# Patient Record
Sex: Male | Born: 1955
Health system: Southern US, Community
[De-identification: ages and names within clinical notes are randomized; demographics above are authoritative.]

## PROBLEM LIST (undated history)

## (undated) DIAGNOSIS — F17200 Nicotine dependence, unspecified, uncomplicated: Secondary | ICD-10-CM

## (undated) HISTORY — DX: Nicotine dependence, unspecified, uncomplicated: F17.200

---

## 2003-01-27 ENCOUNTER — Emergency Department (HOSPITAL_COMMUNITY): Admission: EM | Admit: 2003-01-27 | Discharge: 2003-01-27 | Payer: Self-pay | Admitting: Emergency Medicine

## 2003-01-28 ENCOUNTER — Encounter: Payer: Self-pay | Admitting: Emergency Medicine

## 2013-10-07 ENCOUNTER — Emergency Department (HOSPITAL_COMMUNITY)
Admission: EM | Admit: 2013-10-07 | Discharge: 2013-10-07 | Disposition: A | Discharge status: Home / Self Care | Payer: BC Managed Care – PPO | Attending: Emergency Medicine | Admitting: Emergency Medicine

## 2013-10-07 DIAGNOSIS — R51 Headache: Secondary | ICD-10-CM | POA: Insufficient documentation

## 2013-10-07 DIAGNOSIS — R071 Chest pain on breathing: Secondary | ICD-10-CM | POA: Insufficient documentation

## 2013-10-07 DIAGNOSIS — M545 Low back pain, unspecified: Secondary | ICD-10-CM | POA: Insufficient documentation

## 2013-10-07 DIAGNOSIS — F17200 Nicotine dependence, unspecified, uncomplicated: Secondary | ICD-10-CM | POA: Insufficient documentation

## 2013-10-07 DIAGNOSIS — R059 Cough, unspecified: Secondary | ICD-10-CM | POA: Insufficient documentation

## 2013-10-07 DIAGNOSIS — B9789 Other viral agents as the cause of diseases classified elsewhere: Secondary | ICD-10-CM | POA: Insufficient documentation

## 2013-10-07 DIAGNOSIS — B349 Viral infection, unspecified: Secondary | ICD-10-CM

## 2013-10-07 DIAGNOSIS — R000 Tachycardia, unspecified: Secondary | ICD-10-CM | POA: Insufficient documentation

## 2013-10-07 DIAGNOSIS — R0602 Shortness of breath: Secondary | ICD-10-CM | POA: Insufficient documentation

## 2013-10-07 DIAGNOSIS — J029 Acute pharyngitis, unspecified: Secondary | ICD-10-CM | POA: Insufficient documentation

## 2013-10-07 DIAGNOSIS — R5381 Other malaise: Secondary | ICD-10-CM | POA: Insufficient documentation

## 2013-10-07 DIAGNOSIS — R05 Cough: Secondary | ICD-10-CM | POA: Insufficient documentation

## 2013-10-07 DIAGNOSIS — J3489 Other specified disorders of nose and nasal sinuses: Secondary | ICD-10-CM | POA: Insufficient documentation

## 2013-10-07 DIAGNOSIS — R5383 Other fatigue: Secondary | ICD-10-CM

## 2013-10-07 MED ORDER — BENZONATATE 100 MG PO CAPS
100.0000 mg | ORAL_CAPSULE | Freq: Three times a day (TID) | ORAL | Status: DC
Start: 2013-10-07 — End: 2015-11-25

## 2013-10-07 MED ORDER — HYDROCODONE-HOMATROPINE 5-1.5 MG/5ML PO SYRP: 5.0000 mL | ORAL_SOLUTION | Freq: Four times a day (QID) | ORAL | Status: DC | PRN

## 2013-10-07 NOTE — Discharge Instructions (Signed)
Viral Infections °A virus is a type of germ. Viruses can cause: °· Minor sore throats. °· Aches and pains. °· Headaches. °· Runny nose. °· Rashes. °· Watery eyes. °· Tiredness. °· Coughs. °· Loss of appetite. °· Feeling sick to your stomach (nausea). °· Throwing up (vomiting). °· Watery poop (diarrhea). °HOME CARE  °· Only take medicines as told by your doctor. °· Drink enough water and fluids to keep your pee (urine) clear or pale yellow. Sports drinks are a good choice. °· Get plenty of rest and eat healthy. Soups and broths with crackers or rice are fine. °GET HELP RIGHT AWAY IF:  °· You have a very bad headache. °· You have shortness of breath. °· You have chest pain or neck pain. °· You have an unusual rash. °· You cannot stop throwing up. °· You have watery poop that does not stop. °· You cannot keep fluids down. °· You or your child has a temperature by mouth above 102° F (38.9° C), not controlled by medicine. °· Your baby is older than 3 months with a rectal temperature of 102° F (38.9° C) or higher. °· Your baby is 3 months old or younger with a rectal temperature of 100.4° F (38° C) or higher. °MAKE SURE YOU:  °· Understand these instructions. °· Will watch this condition. °· Will get help right away if you are not doing well or get worse. °Document Released: 08/27/2008 Document Revised: 12/07/2011 Document Reviewed: 01/20/2011 °ExitCare® Patient Information ©2014 [employer]. ° °

## 2013-10-07 NOTE — ED Provider Notes (Signed)
Medical screening examination/treatment/procedure(s) were performed by non-physician practitioner and as supervising physician I was immediately available for consultation/collaboration.  EKG Interpretation   None         Suzi RootsKevin E Cheyna Retana, MD 10/07/13 1536

## 2013-10-07 NOTE — ED Notes (Signed)
Pt c/o flu like symptoms, shortness of breath, fever and non productive cough for one week. Pt c/o sore ribs with coughing. Pt with no acute distress. Pt states his symptoms are getting worse.

## 2013-10-07 NOTE — ED Provider Notes (Signed)
CSN: [id number]     Arrival date & time 10/07/13  1458 History  This chart was scribed for non-physician practitioner working with Suzi Roots, MD by Ashley Jacobs, ED scribe. This patient was seen in room WTR5/WTR5 and the patient's care was started at 3:16 PM.   First MD Initiated Contact with Patient 10/07/13 1508     No chief complaint on file.  (Consider location/radiation/quality/duration/timing/severity/associated sxs/prior Treatment) The history is provided by the patient and medical records. No language interpreter was used.   HPI Comments: Shawn Rivera is a 58 y.o. male who presents to the Emergency Department complaining of flu-like symptoms. Pt has the associated symptoms of fatigue, constant dry cough, chest wall soreness, SOB, subjective fever, tachycardia, sinus pain, headache,lumbar back pain, myalgia, and congestion. Pt denies nausea, vomiting, and diarrhea. He has tried cough syrup to no relief. Pt had a flu shot this year.  He does not have any known allergies to medications. He currently smokes and wants to quit. Pt does not have a current PCP. No past medical history on file. No past surgical history on file. No family history on file. History  Substance Use Topics  . Smoking status: Not on file  . Smokeless tobacco: Not on file  . Alcohol Use: Not on file    Review of Systems  Constitutional: Positive for fever and fatigue. Negative for chills.  HENT: Positive for congestion, rhinorrhea, sinus pressure and sore throat.   Respiratory: Positive for cough and shortness of breath.   Cardiovascular: Positive for chest pain.  Gastrointestinal: Negative for nausea, vomiting and diarrhea.  Musculoskeletal: Positive for arthralgias, back pain and myalgias.  Neurological: Positive for headaches.  All other systems reviewed and are negative.    Allergies  Review of patient's allergies indicates not on file.  Home Medications  No current outpatient  prescriptions on file. BP 142/76  Pulse 85  Temp(Src) 98.2 F (36.8 C) (Oral)  Resp 16  SpO2 97% Physical Exam  Nursing note and vitals reviewed. Constitutional: He is oriented to person, place, and time. He appears well-developed and well-nourished. No distress.  HENT:  Head: Normocephalic and atraumatic.  Mouth/Throat: Oropharynx is clear and moist.  Eyes: Conjunctivae are normal. Pupils are equal, round, and reactive to light. No scleral icterus.  Neck: Neck supple.  Cardiovascular: Normal rate, regular rhythm, normal heart sounds and intact distal pulses.   No murmur heard. Pulmonary/Chest: Effort normal and breath sounds normal. No stridor. No respiratory distress. He has no wheezes. He has no rales.  Musculoskeletal: Normal range of motion. He exhibits no edema.  Neurological: He is alert and oriented to person, place, and time.  Skin: Skin is warm and dry. No rash noted.  Psychiatric: He has a normal mood and affect. His behavior is normal.    ED Course  Procedures (including critical care time) DIAGNOSTIC STUDIES: Oxygen Saturation is 97% on room air, normal by my interpretation.    COORDINATION OF CARE:  3:20 PM Sxs suggest of flu-like infection.  Discussed course of care with pt cough medications . Pt understands and agrees. Low suspicion for strep throat.  No hypoxia to suggest pna.  Not on ACEi to suggest ACEi induced cough  Labs Review Labs Reviewed - No data to display Imaging Review No results found.  EKG Interpretation   None       MDM   1. Viral syndrome    BP 142/76  Pulse 85  Temp(Src) 98.2 F (36.8 C) (Oral)  Resp 16  SpO2 97%  I personally performed the services described in this documentation, which was scribed in my presence. The recorded information has been reviewed and is accurate.      Fayrene HelperBowie Jhony Antrim, PA-C 10/07/13 1526

## 2015-11-25 ENCOUNTER — Encounter (HOSPITAL_COMMUNITY): Payer: Self-pay

## 2015-11-25 ENCOUNTER — Emergency Department (HOSPITAL_COMMUNITY): Payer: BLUE CROSS/BLUE SHIELD

## 2015-11-25 ENCOUNTER — Emergency Department (HOSPITAL_COMMUNITY)
Admission: EM | Admit: 2015-11-25 | Discharge: 2015-11-25 | Disposition: A | Discharge status: Home / Self Care | Payer: BLUE CROSS/BLUE SHIELD | Attending: Emergency Medicine | Admitting: Emergency Medicine

## 2015-11-25 DIAGNOSIS — R05 Cough: Secondary | ICD-10-CM | POA: Diagnosis present

## 2015-11-25 DIAGNOSIS — R61 Generalized hyperhidrosis: Secondary | ICD-10-CM | POA: Insufficient documentation

## 2015-11-25 DIAGNOSIS — B349 Viral infection, unspecified: Secondary | ICD-10-CM | POA: Diagnosis not present

## 2015-11-25 DIAGNOSIS — F17200 Nicotine dependence, unspecified, uncomplicated: Secondary | ICD-10-CM | POA: Insufficient documentation

## 2015-11-25 MED ORDER — PREDNISONE 20 MG PO TABS: 40.0000 mg | ORAL_TABLET | Freq: Every day | ORAL | Status: DC

## 2015-11-25 MED ORDER — IBUPROFEN 800 MG PO TABS
800.0000 mg | ORAL_TABLET | Freq: Once | ORAL | Status: DC
  Filled 2015-11-25 (×2): qty 1

## 2015-11-25 MED ORDER — BENZONATATE 100 MG PO CAPS
100.0000 mg | ORAL_CAPSULE | Freq: Once | ORAL | Status: AC
  Administered 2015-11-25: 100 mg via ORAL
  Filled 2015-11-25: qty 1

## 2015-11-25 MED ORDER — FLUTICASONE PROPIONATE 50 MCG/ACT NA SUSP
2.0000 | Freq: Every day | NASAL | Status: DC
  Administered 2015-11-25: 2 via NASAL
  Filled 2015-11-25: qty 16

## 2015-11-25 MED ORDER — FLUTICASONE PROPIONATE 50 MCG/ACT NA SUSP
2.0000 | Freq: Every day | NASAL | Status: DC
  Filled 2015-11-25: qty 16

## 2015-11-25 MED ORDER — HYDROCODONE-HOMATROPINE 5-1.5 MG/5ML PO SYRP: 5.0000 mL | ORAL_SOLUTION | Freq: Every evening | ORAL | Status: DC | PRN

## 2015-11-25 MED ORDER — IBUPROFEN 800 MG PO TABS
800.0000 mg | ORAL_TABLET | Freq: Once | ORAL | Status: AC
  Administered 2015-11-25: 800 mg via ORAL

## 2015-11-25 MED ORDER — PREDNISONE 20 MG PO TABS
60.0000 mg | ORAL_TABLET | Freq: Once | ORAL | Status: AC
  Administered 2015-11-25: 60 mg via ORAL
  Filled 2015-11-25: qty 3

## 2015-11-25 MED ORDER — BENZONATATE 100 MG PO CAPS: 100.0000 mg | ORAL_CAPSULE | Freq: Three times a day (TID) | ORAL | Status: DC | PRN

## 2015-11-25 MED ORDER — FLUTICASONE PROPIONATE 50 MCG/ACT NA SUSP: 2.0000 | Freq: Every day | NASAL | Status: DC

## 2015-11-25 MED ORDER — ALBUTEROL SULFATE HFA 108 (90 BASE) MCG/ACT IN AERS
2.0000 | INHALATION_SPRAY | Freq: Once | RESPIRATORY_TRACT | Status: AC
  Administered 2015-11-25: 2 via RESPIRATORY_TRACT
  Filled 2015-11-25: qty 6.7

## 2015-11-25 NOTE — ED Notes (Signed)
Pt complains of a cough, congestion and a headache for one week, also states that he's had chills

## 2015-11-25 NOTE — Discharge Instructions (Signed)
Viral Infections °A viral infection can be caused by different types of viruses. Most viral infections are not serious and resolve on their own. However, some infections may cause severe symptoms and may lead to further complications. °SYMPTOMS °Viruses can frequently cause: °· Minor sore throat. °· Aches and pains. °· Headaches. °· Runny nose. °· Different types of rashes. °· Watery eyes. °· Tiredness. °· Cough. °· Loss of appetite. °· Gastrointestinal infections, resulting in nausea, vomiting, and diarrhea. °These symptoms do not respond to antibiotics because the infection is not caused by bacteria. However, you might catch a bacterial infection following the viral infection. This is sometimes called a "superinfection." Symptoms of such a bacterial infection may include: °· Worsening sore throat with pus and difficulty swallowing. °· Swollen neck glands. °· Chills and a high or persistent fever. °· Severe headache. °· Tenderness over the sinuses. °· Persistent overall ill feeling (malaise), muscle aches, and tiredness (fatigue). °· Persistent cough. °· Yellow, green, or brown mucus production with coughing. °HOME CARE INSTRUCTIONS  °· Only take over-the-counter or prescription medicines for pain, discomfort, diarrhea, or fever as directed by your caregiver. °· Drink enough water and fluids to keep your urine clear or pale yellow. Sports drinks can provide valuable electrolytes, sugars, and hydration. °· Get plenty of rest and maintain proper nutrition. Soups and broths with crackers or rice are fine. °SEEK IMMEDIATE MEDICAL CARE IF:  °· You have severe headaches, shortness of breath, chest pain, neck pain, or an unusual rash. °· You have uncontrolled vomiting, diarrhea, or you are unable to keep down fluids. °· You or your child has an oral temperature above 102° F (38.9° C), not controlled by medicine. °· Your baby is older than 3 months with a rectal temperature of 102° F (38.9° C) or higher. °· Your baby is 3  months old or younger with a rectal temperature of 100.4° F (38° C) or higher. °MAKE SURE YOU:  °· Understand these instructions. °· Will watch your condition. °· Will get help right away if you are not doing well or get worse. °  °This information is not intended to replace advice given to you by your health care provider. Make sure you discuss any questions you have with your health care provider. °  °Document Released: 06/24/2005 Document Revised: 12/07/2011 Document Reviewed: 02/20/2015 °Elsevier Interactive Patient Education ©2016 Elsevier Inc. ° °

## 2015-11-25 NOTE — ED Provider Notes (Signed)
CSN: [id number]     Arrival date & time 11/25/15  2040 History  By signing my name below, I, Shawn Rivera, attest that this documentation has been prepared under the direction and in the presence of [employer], PA-C. Electronically Signed: Doreatha Rivera, ED Scribe. 11/25/2015. 11:02 PM.     Chief Complaint  Patient presents with  . Fever  . Cough     The history is provided by the patient. No language interpreter was used.     HPI Comments: Shawn Rivera is a 60 y.o. male otherwise healthy who presents to the Emergency Department complaining of moderate, constant, frontal HA onset 2 weeks ago with associated sinus pressure, generalized myalgias, chills, congestion, subjective fever, cough. Pt denies having any sick contacts with similar symptoms. Pt reports his symptoms are similar to prior flu. He has taken advil and robitussin with mild to moderate relief of symptoms. He states he had his flu shot this year. 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 Pt is not currently followed by a PCP. He denies nausea, emesis.      History reviewed. No pertinent past medical history. History reviewed. No pertinent past surgical history. History reviewed. No pertinent family history. Social History  Substance Use Topics  . Smoking status: Current Every Day Smoker  . Smokeless tobacco: None  . Alcohol Use: Yes    Review of Systems  Constitutional: Positive for fever ( subjective), chills and diaphoresis.  HENT: Positive for congestion and sinus pressure.   Respiratory: Positive for cough.   Gastrointestinal: Negative for nausea and vomiting.  Musculoskeletal: Positive for myalgias.  Neurological: Positive for headaches.  All other systems reviewed and are negative.   Allergies  Review of patient's allergies indicates no known allergies.  Home Medications   Prior to Admission medications   Medication Sig Start Date End Date Taking? Authorizing Provider  benzonatate (TESSALON) 100  MG capsule Take 1 capsule (100 mg total) by mouth 3 (three) times daily as needed for cough. 11/25/15   Antony Madura, PA-C  fluticasone (FLONASE) 50 MCG/ACT nasal spray Place 2 sprays into both nostrils daily. 11/25/15   Antony Madura, PA-C  guaiFENesin (ROBITUSSIN) 100 MG/5ML liquid Take 200 mg by mouth 3 (three) times daily as needed for cough.    Historical Provider, MD  HYDROcodone-homatropine (HYCODAN) 5-1.5 MG/5ML syrup Take 5 mLs by mouth at bedtime as needed for cough. 11/25/15   Antony Madura, PA-C  ibuprofen (ADVIL,MOTRIN) 200 MG tablet Take 400 mg by mouth every 6 (six) hours as needed.    Historical Provider, MD  predniSONE (DELTASONE) 20 MG tablet Take 2 tablets (40 mg total) by mouth daily. 11/25/15   Antony Madura, PA-C   BP 132/96 mmHg  Pulse 60  Temp(Src) 98.5 F (36.9 C) (Oral)  Resp 16  SpO2 100%   Physical Exam  Constitutional: He is oriented to person, place, and time. He appears well-developed and well-nourished. No distress.  Nontoxic/nonseptic appearing  HENT:  Head: Normocephalic and atraumatic.  Right Ear: Tympanic membrane, external ear and ear canal normal.  Left Ear: Tympanic membrane, external ear and ear canal normal.  Nose: Mucosal edema (mild) present. No rhinorrhea.  Mouth/Throat: Uvula is midline, oropharynx is clear and moist and mucous membranes are normal.  Oropharynx clear. Uvula midline. Patient tolerating secretions without difficulty  Eyes: Conjunctivae and EOM are normal. No scleral icterus.  Neck: Normal range of motion.  No nuchal rigidity or meningismus  Cardiovascular: Normal rate, regular rhythm and intact  distal pulses.   Pulmonary/Chest: Effort normal and breath sounds normal. No respiratory distress. He has no wheezes. He has no rales.  Respirations even and unlabored. Lungs clear bilaterally.  Musculoskeletal: Normal range of motion.  Neurological: He is alert and oriented to person, place, and time. He exhibits normal muscle tone. Coordination  normal.  Ambulatory with steady gait  Skin: Skin is warm and dry. No rash noted. He is not diaphoretic. No erythema. No pallor.  Psychiatric: He has a normal mood and affect. His behavior is normal.  Nursing note and vitals reviewed.   ED Course  Procedures (including critical care time)  DIAGNOSTIC STUDIES: Oxygen Saturation is 97% on RA, normal by my interpretation.    COORDINATION OF CARE: 11:00 PM Discussed treatment plan with pt at bedside which includes CXR, symptomatic treatment and pt agreed to plan.    Imaging Review Dg Chest 2 View  11/25/2015  CLINICAL DATA:  Acute onset of cough, chest congestion and fever. Initial encounter. EXAM: CHEST  2 VIEW COMPARISON:  None. FINDINGS: The lungs are well-aerated. Mild vascular congestion is noted. There is no evidence of focal opacification, pleural effusion or pneumothorax. The heart is normal in size; the mediastinal contour is within normal limits. No acute osseous abnormalities are seen. IMPRESSION: Mild vascular congestion noted.  Lungs remain grossly clear. Electronically Signed   By: Shawn Rivera M.D.   On: 11/25/2015 23:32     I have personally reviewed and evaluated these images as part of my medical decision-making.   MDM   Final diagnoses:  Viral syndrome    Patient with symptoms consistent with viral illness, suspect influenza. Vitals are stable, afebrile. No signs of dehydration, tolerating oral fluids. Lungs are clear. CXR negative for PNA. No hypoxia today. Patient will be discharged with instructions for supportive care. Patient will also be given a cough suppressant. Primary care follow up advised for recheck in urgent precautions given. Patient discharged in good condition with no unaddressed concerns.  I personally performed the services described in this documentation, which was scribed in my presence. The recorded information has been reviewed and is accurate.    Filed Vitals:   11/25/15 2051 11/25/15 2356   BP: 116/83 132/96  Pulse: 85 60  Temp: 98.5 F (36.9 C)   TempSrc: Oral   Resp: 20 16  SpO2: 97% 100%     Antony Madura, PA-C 11/26/15 0022  Gilda Crease, MD 11/26/15 [phone]

## 2017-11-06 DIAGNOSIS — H5213 Myopia, bilateral: Secondary | ICD-10-CM | POA: Diagnosis not present

## 2018-01-26 ENCOUNTER — Encounter: Payer: BLUE CROSS/BLUE SHIELD | Admitting: Nurse Practitioner

## 2018-03-10 ENCOUNTER — Ambulatory Visit: Payer: BLUE CROSS/BLUE SHIELD | Admitting: Family Medicine

## 2018-03-10 DIAGNOSIS — Z0289 Encounter for other administrative examinations: Secondary | ICD-10-CM

## 2018-11-07 ENCOUNTER — Other Ambulatory Visit (INDEPENDENT_AMBULATORY_CARE_PROVIDER_SITE_OTHER): Payer: BLUE CROSS/BLUE SHIELD

## 2018-11-07 ENCOUNTER — Ambulatory Visit (INDEPENDENT_AMBULATORY_CARE_PROVIDER_SITE_OTHER): Payer: BLUE CROSS/BLUE SHIELD | Admitting: Nurse Practitioner

## 2018-11-07 ENCOUNTER — Encounter (INDEPENDENT_AMBULATORY_CARE_PROVIDER_SITE_OTHER): Payer: Self-pay

## 2018-11-07 ENCOUNTER — Encounter: Payer: Self-pay | Admitting: Nurse Practitioner

## 2018-11-07 VITALS — BP 142/74 | HR 86 | Temp 98.2°F | Resp 16 | Ht >= 80 in | Wt 268.0 lb

## 2018-11-07 DIAGNOSIS — R202 Paresthesia of skin: Secondary | ICD-10-CM

## 2018-11-07 DIAGNOSIS — R030 Elevated blood-pressure reading, without diagnosis of hypertension: Secondary | ICD-10-CM

## 2018-11-07 DIAGNOSIS — M25511 Pain in right shoulder: Secondary | ICD-10-CM

## 2018-11-07 DIAGNOSIS — N5089 Other specified disorders of the male genital organs: Secondary | ICD-10-CM

## 2018-11-07 DIAGNOSIS — Z23 Encounter for immunization: Secondary | ICD-10-CM | POA: Diagnosis not present

## 2018-11-07 DIAGNOSIS — I10 Essential (primary) hypertension: Secondary | ICD-10-CM | POA: Insufficient documentation

## 2018-11-07 LAB — TSH: TSH: 1.72 u[IU]/mL (ref 0.35–4.50)

## 2018-11-07 LAB — CBC
HCT: 45.3 % (ref 39.0–52.0)
HEMOGLOBIN: 15.4 g/dL (ref 13.0–17.0)
MCHC: 33.9 g/dL (ref 30.0–36.0)
MCV: 86.5 fl (ref 78.0–100.0)
Platelets: 220 10*3/uL (ref 150.0–400.0)
RBC: 5.24 Mil/uL (ref 4.22–5.81)
RDW: 14.8 % (ref 11.5–15.5)
WBC: 10.4 10*3/uL (ref 4.0–10.5)

## 2018-11-07 LAB — COMPREHENSIVE METABOLIC PANEL
ALK PHOS: 98 U/L (ref 39–117)
ALT: 15 U/L (ref 0–53)
AST: 15 U/L (ref 0–37)
Albumin: 4 g/dL (ref 3.5–5.2)
BUN: 9 mg/dL (ref 6–23)
CO2: 25 mEq/L (ref 19–32)
CREATININE: 1.1 mg/dL (ref 0.40–1.50)
Calcium: 9.2 mg/dL (ref 8.4–10.5)
Chloride: 107 mEq/L (ref 96–112)
GFR: 82.26 mL/min (ref >60.00)
GLUCOSE: 132 mg/dL — AB (ref 70–99)
POTASSIUM: 3.8 meq/L (ref 3.5–5.1)
SODIUM: 140 meq/L (ref 135–145)
Total Bilirubin: 0.4 mg/dL (ref 0.2–1.2)
Total Protein: 6.5 g/dL (ref 6.0–8.3)

## 2018-11-07 LAB — SEDIMENTATION RATE: SED RATE: 20 mm/h (ref 0–20)

## 2018-11-07 LAB — VITAMIN B12: VITAMIN B 12: 326 pg/mL (ref 211–911)

## 2018-11-07 LAB — PSA: PSA: 0.5 ng/mL (ref 0.10–4.00)

## 2018-11-07 LAB — HEMOGLOBIN A1C: Hgb A1c MFr Bld: 7.3 % — ABNORMAL HIGH (ref 4.6–6.5)

## 2018-11-07 NOTE — Assessment & Plan Note (Signed)
BP sligtly elevated today and on documented past readings as well He was encouraged to begin monitoring BP readings at home, he will return in about 1 month for follow up with home log- we will consider starting antihypertensives if BP remains elevated

## 2018-11-07 NOTE — Patient Instructions (Addendum)
Head downstairs for labs  You will be contacted to schedule ultrasound.  I will see you back in about 1 month, to recheck your blood pressure.   Scrotal Swelling Scrotal swelling refers to a condition in which the sac of skin that contains the testes (scrotum) is enlarged or swollen. Many things can cause the scrotum to enlarge or swell, including:  Fluid around the testicle (hydrocele).  A weakened area in the muscles around the groin (hernia).  An enlarged vein around the testicle (varicocele).  An injury.  An infection.  Certain medical treatments.  Certain medical conditions, such as congestive heart failure.  A recent genital surgery or procedure.  A twisting of the spermatic cord that cuts off blood supply (testicular torsion).  Testicular cancer. Scrotal swelling can happen along with scrotal pain. Follow these instructions at home:  Until the swelling goes away: ? Rest. The best position to rest in is to lie down. ? Limit activity.  Put ice on the scrotum: ? Put ice in a plastic bag. ? Place a towel between your skin and the bag. ? Leave the ice on for 20 minutes, 2-3 times a day for 1-2 days.  Place a rolled towel under your testicles for support.  Wear loose-fitting clothing or an athletic support cup for comfort.  Take over-the-counter and prescription medicines only as told by your health care provider.  Perform a monthly self-exam of the scrotum and penis. Feel for changes. Ask your health care provider how to perform a monthly self-exam if you are unsure. Contact a health care provider if:  You have a sudden pain that is persistent and does not improve.  You have a heavy feeling or notice fluid in the scrotum.  You have pain or burning while urinating.  You have blood in your urine or semen.  You feel a lump around the testicle.  You notice that one testicle is larger than the other. Keep in mind that a small difference in size is  normal.  You have a persistent dull ache or pain in your groin or scrotum. Get help right away if:  The pain does not go away.  The pain becomes severe.  You have a fever or chills.  You have pain or vomiting that cannot be controlled.  One or both sides of the scrotum are very red and swollen.  There is redness spreading upward from your scrotum to your abdomen or downward from your scrotum to your thighs. Summary  Scrotal swelling refers to a condition in which the sac of skin that contains the testes (scrotum) is enlarged.  Many things can cause the scrotum to swell, including hydrocele, a hernia, and a varicocele.  Limiting activity and icing the scrotum may help reduce swelling and pain.  Contact your health care provider if you develop scrotal pain that is sudden and persistent, or if you have pain while urinating. Do this also if you feel a lump around the testicle or notice blood in your urine or semen.  Get help right away for uncontrolled pain or vomiting, for very red and swollen scrotum, or for fever or chills. This information is not intended to replace advice given to you by your health care provider. Make sure you discuss any questions you have with your health care provider. Document Released: 10/17/2010 Document Revised: 11/30/2016 Document Reviewed: 11/30/2016 Elsevier Interactive Patient Education  2019 [employer].

## 2018-11-07 NOTE — Progress Notes (Signed)
Shawn Rivera is a 63 y.o. male with the following history as recorded in EpicCare:  There are no active problems to display for this patient.   No current outpatient medications on file.   No current facility-administered medications for this visit.     Allergies: Patient has no known allergies.  History reviewed. No pertinent past medical history.  History reviewed. No pertinent surgical history.  Family History  Problem Relation Age of Onset  . Heart disease Mother   . Hypertension Mother     Social History   Tobacco Use  . Smoking status: Current Every Day Smoker  Substance Use Topics  . Alcohol use: Yes     Subjective:  Shawn Rivera is here today to establish care as a new patient to our practice, no PCP prior to now. 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 He is not maintained on any daily medications. He is requesting evaluation of right shoulder/arm pain and numbness, scrotal swelling, and actually tells me he only made the appointment at the request of his wife who is very concerned about his complaints. His BP is slightly elevated, he does not routinely check BP readings at home.  BP Readings from Last 3 Encounters:  11/07/18 (!) 142/74  11/25/15 132/96  10/07/13 142/76   Right shoulder pain- constant aching, for at least 4 months, worse with movement, radiates down arm to right hand, has also noticed numbness/tingling to the right arm and says he can 'hear a clicking" in his right shoulder with movement No swelling, skin discoloration, weakness, decreased ROM, known injuries. Has not tried anything for the pain  Right testicular swelling- occasionally painful with certain movements or when sitting a certain way, first noticed the swelling about 1 year ago, which seemed to worsen after he climbed a fence several months ago He has not felt any lumps or masses.  Review of Systems  Constitutional: Negative for chills, fever and malaise/fatigue.   Respiratory: Negative for cough and shortness of breath.   Cardiovascular: Negative for chest pain, palpitations and leg swelling.  Gastrointestinal: Negative for abdominal pain, blood in stool, constipation, diarrhea, nausea and vomiting.  Genitourinary: Negative for dysuria, frequency, hematuria and urgency.  Musculoskeletal: Negative for falls.  Neurological: Negative for dizziness, loss of consciousness and weakness.  Psychiatric/Behavioral: Negative for memory loss.   Objective:  Vitals:   11/07/18 0933  BP: (!) 142/74  Pulse: 86  Resp: 16  Temp: 98.2 F (36.8 C)  TempSrc: Oral  SpO2: 98%  Weight: 268 lb (121.6 kg)  Height: 6\' 8"  (2.032 m)    General: Well developed, well nourished, in no acute distress  Skin : Warm and dry. No rash, erythema, ecchymosis. Head: Normocephalic and atraumatic  Eyes: Sclera and conjunctiva clear; pupils round and reactive to light; extraocular movements intact  Ears: External normal; canals clear; tympanic membranes normal  Oropharynx: Pink, supple. No suspicious lesions  Neck: Supple without thyromegaly, adenopathy  Lungs: Respirations unlabored; clear to auscultation bilaterally without wheeze, rales, rhonchi  CVS exam: Normal rate and regular rhythm, S1 and S2 normal.  Abdomen: Soft; nontender; rotund; normoactive bowel sounds; no masses or hepatosplenomegaly  Genitourinary: He exhibits no testicular tenderness and no scrotal tenderness.  Genitourinary Comments: Right testicular/scrotal swelling without erythema or discoloration, no palpable lumps or masses; left testicle wnl; penis WNL Musculoskeletal: No deformities; no active joint inflammation; normal ROM Extremities: No edema, cyanosis, clubbing  Vessels: Symmetric bilaterally  Neurologic: Alert and oriented; speech intact;  face symmetrical; moves all extremities well; CNII-XII intact without focal deficit  Psychiatric: Normal mood and affect.  GU exam done with chaperone at  bedside  Assessment:  1. Scrotal swelling   2. Right shoulder pain, unspecified chronicity   3. Paresthesia   4. Elevated blood pressure reading     Plan:   Scrotal swelling Labs, [place] ordered for further evaluation Home management, red flags and return precautions including when to seek immediate care discussed and printed on AVS F/U with further recommendations pending results-Will consider urology referral depending on findings - PSA; Future - Urine cytology ancillary only - RPR; Future - HIV Antibody (routine testing w rflx); Future - US SCROTUM W/DOPPLER; Future  Right shoulder pain, unspecified chronicity Imaging ordered for further evaluation F/U with further recommendations pending results - DG Shoulder Right; Future  Paresthesia labs ordered for further evaluation F/U with further recommendations pending results - CBC; Future - Comprehensive metabolic panel; Future - Vitamin B12; Future - TSH; Future - Sedimentation rate; Future - ANA; Future - DG Shoulder Right; Future - Hemoglobin A1c; Future  Need for Tdap vaccination - Tdap vaccine greater than or equal to 7yo IM  Return in about 1 month (around 12/06/2018) for F/U: elevated BP- recheck; CPE?Marland Kitchen

## 2018-11-08 LAB — HIV ANTIBODY (ROUTINE TESTING W REFLEX): HIV: NONREACTIVE

## 2018-11-08 LAB — RPR: RPR Ser Ql: NONREACTIVE

## 2018-11-08 LAB — ANA: ANA: NEGATIVE

## 2018-11-11 ENCOUNTER — Other Ambulatory Visit: Payer: Self-pay | Admitting: Nurse Practitioner

## 2018-11-17 ENCOUNTER — Encounter: Payer: Self-pay | Admitting: *Deleted

## 2018-11-18 ENCOUNTER — Encounter: Payer: Self-pay | Admitting: Nurse Practitioner

## 2018-12-06 ENCOUNTER — Ambulatory Visit (INDEPENDENT_AMBULATORY_CARE_PROVIDER_SITE_OTHER): Payer: BLUE CROSS/BLUE SHIELD | Admitting: Nurse Practitioner

## 2018-12-06 ENCOUNTER — Encounter: Payer: Self-pay | Admitting: Nurse Practitioner

## 2018-12-06 ENCOUNTER — Ambulatory Visit (INDEPENDENT_AMBULATORY_CARE_PROVIDER_SITE_OTHER)
Admission: RE | Admit: 2018-12-06 | Discharge: 2018-12-06 | Disposition: A | Discharge status: Home / Self Care | Payer: BLUE CROSS/BLUE SHIELD | Source: Ambulatory Visit | Attending: Nurse Practitioner | Admitting: Nurse Practitioner

## 2018-12-06 VITALS — BP 132/90 | HR 78 | Ht >= 80 in | Wt 271.0 lb

## 2018-12-06 DIAGNOSIS — Z1211 Encounter for screening for malignant neoplasm of colon: Secondary | ICD-10-CM

## 2018-12-06 DIAGNOSIS — E119 Type 2 diabetes mellitus without complications: Secondary | ICD-10-CM

## 2018-12-06 DIAGNOSIS — I10 Essential (primary) hypertension: Secondary | ICD-10-CM | POA: Diagnosis not present

## 2018-12-06 DIAGNOSIS — M25511 Pain in right shoulder: Secondary | ICD-10-CM | POA: Diagnosis not present

## 2018-12-06 DIAGNOSIS — R202 Paresthesia of skin: Secondary | ICD-10-CM

## 2018-12-06 HISTORY — DX: Type 2 diabetes mellitus without complications: E11.9

## 2018-12-06 MED ORDER — LOSARTAN POTASSIUM 50 MG PO TABS: 50.0000 mg | ORAL_TABLET | Freq: Every day | ORAL | 1 refills | Status: DC

## 2018-12-06 MED ORDER — METFORMIN HCL 500 MG PO TABS: 500.0000 mg | ORAL_TABLET | Freq: Every day | ORAL | 3 refills | Status: DC

## 2018-12-06 NOTE — Assessment & Plan Note (Addendum)
New dx for him- we spent time discussing implications, outcomes, lifestyle modifications Start metformin- med dosing, side effects discussed  Work on healthy lifestyle, additional education on AVS Recheck A1c in 3 months for response - losartan (COZAAR) 50 MG tablet; Take 1 tablet (50 mg total) by mouth daily.  Dispense: 30 tablet; Refill: 1 - metFORMIN (GLUCOPHAGE) 500 MG tablet; Take 1 tablet (500 mg total) by mouth daily with breakfast.  Dispense: 30 tablet; Refill: 3

## 2018-12-06 NOTE — Patient Instructions (Signed)
Call [facility] to schedule your ultrasound: [phone]  Start metformin 500 mg once daily as prescribed- your next A1c will be due around 02/2019  Start losartan 50 mg once daily as prescribed- return in about 3-4 weeks to have your blood pressure rechecked and see how you are doing on the losartan.  Thanks for letting me take care of you!   Diabetes Mellitus and Nutrition, Adult When you have diabetes (diabetes mellitus), it is very important to have healthy eating habits because your blood sugar (glucose) levels are greatly affected by what you eat and drink. Eating healthy foods in the appropriate amounts, at about the same times every day, can help you:  Control your blood glucose.  Lower your risk of heart disease.  Improve your blood pressure.  Reach or maintain a healthy weight. Every person with diabetes is different, and each person has different needs for a meal plan. Your health care provider may recommend that you work with a diet and nutrition specialist (dietitian) to make a meal plan that is best for you. Your meal plan may vary depending on factors such as:  The calories you need.  The medicines you take.  Your weight.  Your blood glucose, blood pressure, and cholesterol levels.  Your activity level.  Other health conditions you have, such as heart or kidney disease. How do carbohydrates affect me? Carbohydrates, also called carbs, affect your blood glucose level more than any other type of food. Eating carbs naturally raises the amount of glucose in your blood. Carb counting is a method for keeping track of how many carbs you eat. Counting carbs is important to keep your blood glucose at a healthy level, especially if you use insulin or take certain oral diabetes medicines. It is important to know how many carbs you can safely have in each meal. This is different for every person. Your dietitian can help you calculate how many carbs you should have at  each meal and for each snack. Foods that contain carbs include:  Bread, cereal, rice, pasta, and crackers.  Potatoes and corn.  Peas, beans, and lentils.  Milk and yogurt.  Fruit and juice.  Desserts, such as cakes, cookies, ice cream, and candy. How does alcohol affect me? Alcohol can cause a sudden decrease in blood glucose (hypoglycemia), especially if you use insulin or take certain oral diabetes medicines. Hypoglycemia can be a life-threatening condition. Symptoms of hypoglycemia (sleepiness, dizziness, and confusion) are similar to symptoms of having too much alcohol. If your health care provider says that alcohol is safe for you, follow these guidelines:  Limit alcohol intake to no more than 1 drink per day for nonpregnant women and 2 drinks per day for men. One drink equals 12 oz of beer, 5 oz of wine, or 1 oz of hard liquor.  Do not drink on an empty stomach.  Keep yourself hydrated with water, diet soda, or unsweetened iced tea.  Keep in mind that regular soda, juice, and other mixers may contain a lot of sugar and must be counted as carbs. What are tips for following this plan?  Reading food labels  Start by checking the serving size on the "Nutrition Facts" label of packaged foods and drinks. The amount of calories, carbs, fats, and other nutrients listed on the label is based on one serving of the item. Many items contain more than one serving per package.  Check the total grams (g) of carbs in one serving. You can calculate the number  of servings of carbs in one serving by dividing the total carbs by 15. For example, if a food has 30 g of total carbs, it would be equal to 2 servings of carbs.  Check the number of grams (g) of saturated and trans fats in one serving. Choose foods that have low or no amount of these fats.  Check the number of milligrams (mg) of salt (sodium) in one serving. Most people should limit total sodium intake to less than 2,300 mg per  day.  Always check the nutrition information of foods labeled as "low-fat" or "nonfat". These foods may be higher in added sugar or refined carbs and should be avoided.  Talk to your dietitian to identify your daily goals for nutrients listed on the label. Shopping  Avoid buying canned, premade, or processed foods. These foods tend to be high in fat, sodium, and added sugar.  Shop around the outside edge of the grocery store. This includes fresh fruits and vegetables, bulk grains, fresh meats, and fresh dairy. Cooking  Use low-heat cooking methods, such as baking, instead of high-heat cooking methods like deep frying.  Cook using healthy oils, such as olive, canola, or sunflower oil.  Avoid cooking with butter, cream, or high-fat meats. Meal planning  Eat meals and snacks regularly, preferably at the same times every day. Avoid going long periods of time without eating.  Eat foods high in fiber, such as fresh fruits, vegetables, beans, and whole grains. Talk to your dietitian about how many servings of carbs you can eat at each meal.  Eat 4-6 ounces (oz) of lean protein each day, such as lean meat, chicken, fish, eggs, or tofu. One oz of lean protein is equal to: ? 1 oz of meat, chicken, or fish. ? 1 egg. ?  cup of tofu.  Eat some foods each day that contain healthy fats, such as avocado, nuts, seeds, and fish. Lifestyle  Check your blood glucose regularly.  Exercise regularly as told by your health care provider. This may include: ? 150 minutes of moderate-intensity or vigorous-intensity exercise each week. This could be brisk walking, biking, or water aerobics. ? Stretching and doing strength exercises, such as yoga or weightlifting, at least 2 times a week.  Take medicines as told by your health care provider.  Do not use any products that contain nicotine or tobacco, such as cigarettes and e-cigarettes. If you need help quitting, ask your health care provider.  Work with  a Veterinary surgeon or diabetes educator to identify strategies to manage stress and any emotional and social challenges. Questions to ask a health care provider  Do I need to meet with a diabetes educator?  Do I need to meet with a dietitian?  What number can I call if I have questions?  When are the best times to check my blood glucose? Where to find more information:  American Diabetes Association: diabetes.org  Academy of Nutrition and Dietetics: www.eatright.[employer] of Diabetes and Digestive and Kidney Diseases (NIH): CarFlippers.tn Summary  A healthy meal plan will help you control your blood glucose and maintain a healthy lifestyle.  Working with a diet and nutrition specialist (dietitian) can help you make a meal plan that is best for you.  Keep in mind that carbohydrates (carbs) and alcohol have immediate effects on your blood glucose levels. It is important to count carbs and to use alcohol carefully. This information is not intended to replace advice given to you by your health care provider.  Make sure you discuss any questions you have with your health care provider. Document Released: 06/11/2005 Document Revised: 04/14/2017 Document Reviewed: 10/19/2016 Elsevier Interactive Patient Education  2019 [employer]

## 2018-12-06 NOTE — Progress Notes (Signed)
Shawn Rivera is a 63 y.o. male with the following history as recorded in EpicCare:  Patient Active Problem List   Diagnosis Date Noted  . Elevated blood pressure reading 11/07/2018    Current Outpatient Medications  Medication Sig Dispense Refill  . losartan (COZAAR) 50 MG tablet Take 1 tablet (50 mg total) by mouth daily. 30 tablet 1  . metFORMIN (GLUCOPHAGE) 500 MG tablet Take 1 tablet (500 mg total) by mouth daily with breakfast. 30 tablet 3   No current facility-administered medications for this visit.     Allergies: Patient has no known allergies.  History reviewed. No pertinent past medical history.  History reviewed. No pertinent surgical history.  Family History  Problem Relation Age of Onset  . Heart disease Mother   . Hypertension Mother     Social History   Tobacco Use  . Smoking status: Current Every Day Smoker  . Smokeless tobacco: Never Used  Substance Use Topics  . Alcohol use: Yes     Subjective:  Shawn Rivera is here today for follow up of elevated blood pressure, maintained off medications, Noted to have higher readings at his visit to establish care with me on 11/07/18. He was asked to log bp for 1 month and return with log. 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 He has a Photographer, does not go, but planning to start. His wife wants him to eat healthier, he knows she will try to help him Denies headaches, vision changes, chest pain, shortness of breath, edema.  BP Readings from Last 3 Encounters:  12/06/18 132/90  11/07/18 (!) 142/74  11/25/15 132/96   At his last visit to establish care on 11/07/17, his a1c was 7.3, he was sent a prescription for metformin 500 daily  to start, says he never received the message to pick up Rx, but is willing to start  Lab Results  Component Value Date   HGBA1C 7.3 (H) 11/07/2018   ROS- See HPI  Objective:  Vitals:   12/06/18 0922  BP: 132/90  Pulse: 78  SpO2: 94%  Weight: 271 lb (122.9 kg)  Height: 6\' 8"  (2.032 m)    General: Well developed, well nourished, in no acute distress  Skin : Warm and dry.  Head: Normocephalic and atraumatic  Eyes: Sclera and conjunctiva clear; pupils round and reactive to light; extraocular movements intact  Oropharynx: Pink, supple. No suspicious lesions  Neck: Supple Lungs: Respirations unlabored; clear to auscultation bilaterally without wheeze, rales, rhonchi  CVS exam: normal rate and regular rhythm, S1 and S2 normal.  Extremities: No edema, cyanosis, clubbing  Vessels: Symmetric bilaterally  Neurologic: Alert and oriented; speech intact; face symmetrical; moves all extremities well; CNII-XII intact without focal deficit  Psychiatric: Normal mood and affect.  Assessment:  1. Type 2 diabetes mellitus without complication, without long-term current use of insulin (HCC)   2. Hypertension, unspecified type   3. Screening for colon cancer     Plan:  Discussed the role of healthy diet and exercise in the management of HTN, DM- he declines nutrition referral today, says he is going to work on healthy lifestyle at home  Screening for  colon cancer - Ambulatory referral to Gastroenterology  Return in about 1 month (around 01/06/2019) for F/U- HTN- starting losartan. , eventually need to have CPE once problems have been addressed

## 2018-12-06 NOTE — Assessment & Plan Note (Signed)
Start losartan- dosing, side effects discussed Encouraged to work on healthy lifestyle, home BP log RTC in 3-4 weeks to have BP, labs checked for response to losartan - losartan (COZAAR) 50 MG tablet; Take 1 tablet (50 mg total) by mouth daily.  Dispense: 30 tablet; Refill: 1

## 2018-12-08 ENCOUNTER — Other Ambulatory Visit: Payer: BLUE CROSS/BLUE SHIELD

## 2018-12-08 ENCOUNTER — Other Ambulatory Visit: Payer: Self-pay | Admitting: Nurse Practitioner

## 2018-12-08 DIAGNOSIS — M25511 Pain in right shoulder: Secondary | ICD-10-CM

## 2018-12-20 ENCOUNTER — Encounter: Payer: Self-pay | Admitting: *Deleted

## 2019-01-06 ENCOUNTER — Ambulatory Visit: Payer: BLUE CROSS/BLUE SHIELD | Admitting: Nurse Practitioner

## 2019-06-23 DIAGNOSIS — M62830 Muscle spasm of back: Secondary | ICD-10-CM | POA: Diagnosis not present

## 2019-06-23 DIAGNOSIS — M545 Low back pain: Secondary | ICD-10-CM | POA: Diagnosis not present

## 2019-06-23 DIAGNOSIS — M9903 Segmental and somatic dysfunction of lumbar region: Secondary | ICD-10-CM | POA: Diagnosis not present

## 2019-06-23 DIAGNOSIS — M9901 Segmental and somatic dysfunction of cervical region: Secondary | ICD-10-CM | POA: Diagnosis not present

## 2019-08-04 ENCOUNTER — Telehealth: Payer: Self-pay | Admitting: Internal Medicine

## 2019-08-04 NOTE — Telephone Encounter (Addendum)
°  Regarding: transfer of care Shawn Rivera or other [place],  Pt due for CPX and f/u DM  Please contact this patient, and ask them to schedule with me as new PCP , in order for the patient to continue have updated ongoing care without being lost in the system, and to keep up a high standard of care at [facility]

## 2019-08-04 NOTE — Telephone Encounter (Signed)
LVM for patient to call back. ?

## 2019-10-30 ENCOUNTER — Ambulatory Visit: Payer: Self-pay | Admitting: Family

## 2019-10-30 ENCOUNTER — Ambulatory Visit: Payer: BLUE CROSS/BLUE SHIELD | Admitting: Family

## 2019-10-30 DIAGNOSIS — Z0289 Encounter for other administrative examinations: Secondary | ICD-10-CM

## 2019-12-01 ENCOUNTER — Encounter: Payer: BC Managed Care – PPO | Admitting: Family

## 2019-12-05 ENCOUNTER — Other Ambulatory Visit: Payer: Self-pay

## 2019-12-05 ENCOUNTER — Ambulatory Visit (INDEPENDENT_AMBULATORY_CARE_PROVIDER_SITE_OTHER): Payer: BC Managed Care – PPO | Admitting: Internal Medicine

## 2019-12-05 ENCOUNTER — Encounter: Payer: Self-pay | Admitting: Internal Medicine

## 2019-12-05 VITALS — BP 140/90 | HR 64 | Temp 97.9°F | Ht >= 80 in | Wt 242.0 lb

## 2019-12-05 DIAGNOSIS — Z1159 Encounter for screening for other viral diseases: Secondary | ICD-10-CM

## 2019-12-05 DIAGNOSIS — F17200 Nicotine dependence, unspecified, uncomplicated: Secondary | ICD-10-CM

## 2019-12-05 DIAGNOSIS — E611 Iron deficiency: Secondary | ICD-10-CM

## 2019-12-05 DIAGNOSIS — E559 Vitamin D deficiency, unspecified: Secondary | ICD-10-CM

## 2019-12-05 DIAGNOSIS — E538 Deficiency of other specified B group vitamins: Secondary | ICD-10-CM

## 2019-12-05 DIAGNOSIS — Z0000 Encounter for general adult medical examination without abnormal findings: Secondary | ICD-10-CM

## 2019-12-05 DIAGNOSIS — Z0001 Encounter for general adult medical examination with abnormal findings: Secondary | ICD-10-CM | POA: Insufficient documentation

## 2019-12-05 DIAGNOSIS — N5089 Other specified disorders of the male genital organs: Secondary | ICD-10-CM

## 2019-12-05 DIAGNOSIS — R6882 Decreased libido: Secondary | ICD-10-CM

## 2019-12-05 DIAGNOSIS — Z125 Encounter for screening for malignant neoplasm of prostate: Secondary | ICD-10-CM

## 2019-12-05 DIAGNOSIS — N529 Male erectile dysfunction, unspecified: Secondary | ICD-10-CM | POA: Diagnosis not present

## 2019-12-05 DIAGNOSIS — E119 Type 2 diabetes mellitus without complications: Secondary | ICD-10-CM

## 2019-12-05 HISTORY — DX: Other specified disorders of the male genital organs: N50.89

## 2019-12-05 HISTORY — DX: Male erectile dysfunction, unspecified: N52.9

## 2019-12-05 LAB — CBC WITH DIFFERENTIAL/PLATELET
Basophils Absolute: 0.1 10*3/uL (ref 0.0–0.1)
Basophils Relative: 0.7 % (ref 0.0–3.0)
Eosinophils Absolute: 0.3 10*3/uL (ref 0.0–0.7)
Eosinophils Relative: 3.1 % (ref 0.0–5.0)
HCT: 44.2 % (ref 39.0–52.0)
Hemoglobin: 14.9 g/dL (ref 13.0–17.0)
Lymphocytes Relative: 35.6 % (ref 12.0–46.0)
Lymphs Abs: 3.7 10*3/uL (ref 0.7–4.0)
MCHC: 33.7 g/dL (ref 30.0–36.0)
MCV: 86.7 fl (ref 78.0–100.0)
Monocytes Absolute: 0.6 10*3/uL (ref 0.1–1.0)
Monocytes Relative: 6.1 % (ref 3.0–12.0)
Neutro Abs: 5.6 10*3/uL (ref 1.4–7.7)
Neutrophils Relative %: 54.5 % (ref 43.0–77.0)
Platelets: 226 10*3/uL (ref 150.0–400.0)
RBC: 5.09 Mil/uL (ref 4.22–5.81)
RDW: 14.8 % (ref 11.5–15.5)
WBC: 10.3 10*3/uL (ref 4.0–10.5)

## 2019-12-05 LAB — URINALYSIS, ROUTINE W REFLEX MICROSCOPIC
Bilirubin Urine: NEGATIVE
Hgb urine dipstick: NEGATIVE
Ketones, ur: NEGATIVE
Leukocytes,Ua: NEGATIVE
Nitrite: NEGATIVE
Specific Gravity, Urine: 1.025 (ref 1.000–1.030)
Total Protein, Urine: NEGATIVE
Urine Glucose: NEGATIVE
Urobilinogen, UA: 1 (ref 0.0–1.0)
pH: 6.5 (ref 5.0–8.0)

## 2019-12-05 LAB — VITAMIN B12: Vitamin B-12: 312 pg/mL (ref 211–911)

## 2019-12-05 LAB — HEPATIC FUNCTION PANEL
ALT: 13 U/L (ref 0–53)
AST: 16 U/L (ref 0–37)
Albumin: 4 g/dL (ref 3.5–5.2)
Alkaline Phosphatase: 89 U/L (ref 39–117)
Bilirubin, Direct: 0.1 mg/dL (ref 0.0–0.3)
Total Bilirubin: 0.5 mg/dL (ref 0.2–1.2)
Total Protein: 6.7 g/dL (ref 6.0–8.3)

## 2019-12-05 LAB — IBC PANEL
Iron: 51 ug/dL (ref 42–165)
Saturation Ratios: 14.7 % — ABNORMAL LOW (ref 20.0–50.0)
Transferrin: 247 mg/dL (ref 212.0–360.0)

## 2019-12-05 LAB — BASIC METABOLIC PANEL
BUN: 8 mg/dL (ref 6–23)
CO2: 25 mEq/L (ref 19–32)
Calcium: 9.2 mg/dL (ref 8.4–10.5)
Chloride: 109 mEq/L (ref 96–112)
Creatinine, Ser: 0.85 mg/dL (ref 0.40–1.50)
GFR: 110.38 mL/min (ref >60.00)
Glucose, Bld: 100 mg/dL — ABNORMAL HIGH (ref 70–99)
Potassium: 3.8 mEq/L (ref 3.5–5.1)
Sodium: 139 mEq/L (ref 135–145)

## 2019-12-05 LAB — LIPID PANEL
Cholesterol: 188 mg/dL (ref 0–200)
HDL: 32.7 mg/dL — ABNORMAL LOW (ref >39.00)
LDL Cholesterol: 127 mg/dL — ABNORMAL HIGH (ref 0–99)
NonHDL: 155.63
Total CHOL/HDL Ratio: 6
Triglycerides: 143 mg/dL (ref 0.0–149.0)
VLDL: 28.6 mg/dL (ref 0.0–40.0)

## 2019-12-05 LAB — VITAMIN D 25 HYDROXY (VIT D DEFICIENCY, FRACTURES): VITD: 8.21 ng/mL — ABNORMAL LOW (ref 30.00–100.00)

## 2019-12-05 LAB — HEMOGLOBIN A1C: Hgb A1c MFr Bld: 6.4 % (ref 4.6–6.5)

## 2019-12-05 LAB — MICROALBUMIN / CREATININE URINE RATIO
Creatinine,U: 200.5 mg/dL
Microalb Creat Ratio: 0.6 mg/g (ref 0.0–30.0)
Microalb, Ur: 1.1 mg/dL (ref 0.0–1.9)

## 2019-12-05 LAB — TSH: TSH: 2.66 u[IU]/mL (ref 0.35–4.50)

## 2019-12-05 LAB — PSA: PSA: 0.7 ng/mL (ref 0.10–4.00)

## 2019-12-05 LAB — TESTOSTERONE: Testosterone: 265.77 ng/dL — ABNORMAL LOW (ref 300.00–890.00)

## 2019-12-05 MED ORDER — SILDENAFIL CITRATE 100 MG PO TABS: 50.0000 mg | ORAL_TABLET | Freq: Every day | ORAL | 11 refills | Status: DC | PRN

## 2019-12-05 MED ORDER — CHANTIX STARTING MONTH PAK 0.5 MG X 11 & 1 MG X 42 PO TABS: ORAL_TABLET | ORAL | 0 refills | Status: DC

## 2019-12-05 MED ORDER — VARENICLINE TARTRATE 1 MG PO TABS: 1.0000 mg | ORAL_TABLET | Freq: Two times a day (BID) | ORAL | 1 refills | Status: DC

## 2019-12-05 NOTE — Assessment & Plan Note (Signed)
For chantix, to quit

## 2019-12-05 NOTE — Assessment & Plan Note (Signed)
For viagra prn,  to f/u any worsening symptoms or concerns 

## 2019-12-05 NOTE — Assessment & Plan Note (Signed)
stable overall by history and exam, recent data reviewed with pt, and pt to continue medical treatment as before,  to f/u any worsening symptoms or concerns  

## 2019-12-05 NOTE — Assessment & Plan Note (Signed)

## 2019-12-05 NOTE — Assessment & Plan Note (Signed)
For testosterone

## 2019-12-05 NOTE — Patient Instructions (Addendum)
Please take all new medication as prescribed - the chantix - so please stop smoking  Please take all new medication as prescribed - the viagra as needed  Please continue all other medications as before, and refills have been done if requested.  Please have the pharmacy call with any other refills you may need.  Please continue your efforts at being more active, low cholesterol diet, and weight control.  You are otherwise up to date with prevention measures today.  Please keep your appointments with your specialists as you may have planned  You will be contacted regarding the referral for: colonoscopy  Please go to the LAB at the blood drawing area for the tests to be done  You will be contacted by phone if any changes need to be made immediately.  Otherwise, you will receive a letter about your results with an explanation, but please check with MyChart first.  Please remember to sign up for MyChart if you have not done so, as this will be important to you in the future with finding out test results, communicating by private email, and scheduling acute appointments online when needed.  Please make an Appointment to return in 6 months, or sooner if needed

## 2019-12-05 NOTE — Progress Notes (Signed)
   Subjective:    Patient ID: Shawn Rivera, male    DOB: [birth year], 64 y.o.   MRN: [id number]  HPI    Here for wellness and f/u;  Overall doing ok;  Pt denies Chest pain, worsening SOB, DOE, wheezing, orthopnea, PND, worsening LE edema, palpitations, dizziness or syncope.  Pt denies neurological change such as new headache, facial or extremity weakness.  Pt denies polydipsia, polyuria, or low sugar symptoms. Pt states overall good compliance with treatment and medications, good tolerability, and has been trying to follow appropriate diet.  Pt denies worsening depressive symptoms, suicidal ideation or panic. No fever, night sweats, wt loss, loss of appetite, or other constitutional symptoms.  Pt states good ability with ADL's, has low fall risk, home safety reviewed and adequate, no other significant changes in hearing or vision, and only occasionally active with exercise. BP Readings from Last 3 Encounters:  12/05/19 140/90  12/06/18 132/90  11/07/18 (!) 142/74   Past Medical History:  Diagnosis Date  . Enlarged testicle 12/05/2019  . Erectile dysfunction 12/05/2019  . Smoker   . Type 2 diabetes mellitus without complication, without long-term current use of insulin (HCC) 12/06/2018   History reviewed. No pertinent surgical history.  reports that he has been smoking. He has never used smokeless tobacco. He reports current alcohol use of about 1.0 standard drinks of alcohol per week. He reports that he does not use drugs. family history includes Heart disease in his mother; Hypertension in his mother. No Known Allergies No current outpatient medications on file prior to visit.   No current facility-administered medications on file prior to visit.    Review of Systems All otherwise neg per pt     Objective:   Physical Exam BP 140/90   Pulse 64   Temp 97.9 F (36.6 C)   Ht 6\' 8"  (2.032 m)   Wt 242 lb (109.8 kg)   SpO2 99%   BMI 26.59 kg/m  VS noted,  Constitutional: Pt appears  in NAD HENT: Head: NCAT.  Right Ear: External ear normal.  Left Ear: External ear normal.  Eyes: . Pupils are equal, round, and reactive to light. Conjunctivae and EOM are normal Nose: without d/c or deformity Neck: Neck supple. Gross normal ROM Cardiovascular: Normal rate and regular rhythm.   Pulmonary/Chest: Effort normal and breath sounds without rales or wheezing.  Abd:  Soft, NT, ND, + BS, no organomegaly Neurological: Pt is alert. At baseline orientation, motor grossly intact Skin: Skin is warm. No rashes, other new lesions, no LE edema Psychiatric: Pt behavior is normal without agitation  All otherwise neg per pt Lab Results  Component Value Date   WBC 10.3 12/05/2019   HGB 14.9 12/05/2019   HCT 44.2 12/05/2019   PLT 226.0 12/05/2019   GLUCOSE 100 (H) 12/05/2019   CHOL 188 12/05/2019   TRIG 143.0 12/05/2019   HDL 32.70 (L) 12/05/2019   LDLCALC 127 (H) 12/05/2019   ALT 13 12/05/2019   AST 16 12/05/2019   NA 139 12/05/2019   K 3.8 12/05/2019   CL 109 12/05/2019   CREATININE 0.85 12/05/2019   BUN 8 12/05/2019   CO2 25 12/05/2019   TSH 2.66 12/05/2019   PSA 0.70 12/05/2019   HGBA1C 6.4 12/05/2019   MICROALBUR 1.1 12/05/2019          Assessment & Plan:  gvi

## 2019-12-06 LAB — HEPATITIS C ANTIBODY
Hepatitis C Ab: NONREACTIVE
SIGNAL TO CUT-OFF: 0.02 (ref <1.00)

## 2019-12-07 ENCOUNTER — Other Ambulatory Visit: Payer: Self-pay | Admitting: Internal Medicine

## 2019-12-07 ENCOUNTER — Encounter: Payer: Self-pay | Admitting: Internal Medicine

## 2019-12-07 DIAGNOSIS — E785 Hyperlipidemia, unspecified: Secondary | ICD-10-CM

## 2019-12-07 DIAGNOSIS — E559 Vitamin D deficiency, unspecified: Secondary | ICD-10-CM

## 2019-12-07 HISTORY — DX: Vitamin D deficiency, unspecified: E55.9

## 2019-12-07 HISTORY — DX: Hyperlipidemia, unspecified: E78.5

## 2019-12-07 MED ORDER — VITAMIN D (ERGOCALCIFEROL) 1.25 MG (50000 UNIT) PO CAPS: 50000.0000 [IU] | ORAL_CAPSULE | ORAL | 0 refills | Status: DC

## 2019-12-07 MED ORDER — ATORVASTATIN CALCIUM 20 MG PO TABS: 20.0000 mg | ORAL_TABLET | Freq: Every day | ORAL | 3 refills | Status: DC

## 2019-12-08 ENCOUNTER — Telehealth: Payer: Self-pay | Admitting: Nurse Practitioner

## 2019-12-08 NOTE — Telephone Encounter (Signed)
New message:   1.Medication Requested: atorvastatin (LIPITOR) 20 MG tablet sildenafil (VIAGRA) 100 MG tablet varenicline (CHANTIX CONTINUING MONTH PAK) 1 MG tablet varenicline (CHANTIX STARTING MONTH PAK) 0.5 MG X 11 & 1 MG X 42 tablet Vitamin D, Ergocalciferol, (DRISDOL) 1.25 MG (50000 UNIT) CAPS capsule 2. Pharmacy (Name, Street, [place]): [facility] [id number] - [place], [place] - [address] 3. On Med List: Yes  4. Last Visit with PCP:   5. Next visit date with PCP:  Pt states these were sent to the wrong pharmacy. Agent: Please be advised that RX refills may take up to 3 business days. We ask that you follow-up with your pharmacy.

## 2019-12-08 NOTE — Telephone Encounter (Signed)
PCP has been updated.

## 2019-12-08 NOTE — Telephone Encounter (Signed)
Ok for me to be PCP  No need refills since each one was already done mar 9 to [facility] on [address], unless he needs mail in pharmacy refills

## 2020-01-24 ENCOUNTER — Encounter: Payer: Self-pay | Admitting: Internal Medicine

## 2020-05-30 IMAGING — DX RIGHT SHOULDER - 2+ VIEW
3 series · 3 of 3 positions shown · non-contrast
Comparison: None.

CLINICAL DATA: Right shoulder pain.

EXAM:
RIGHT SHOULDER - 2+ VIEW

[grashey]
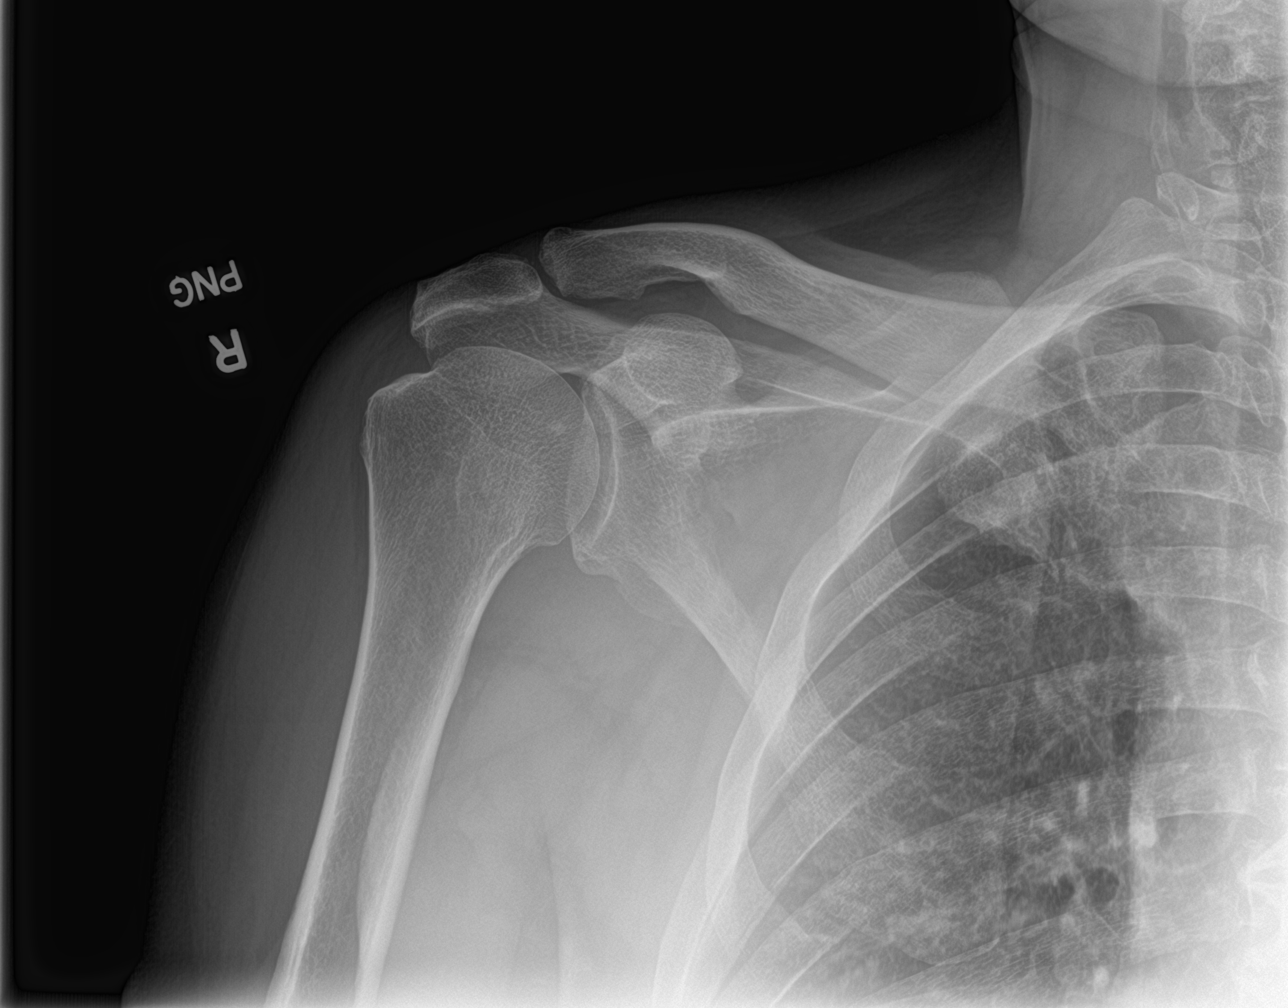

[y view]
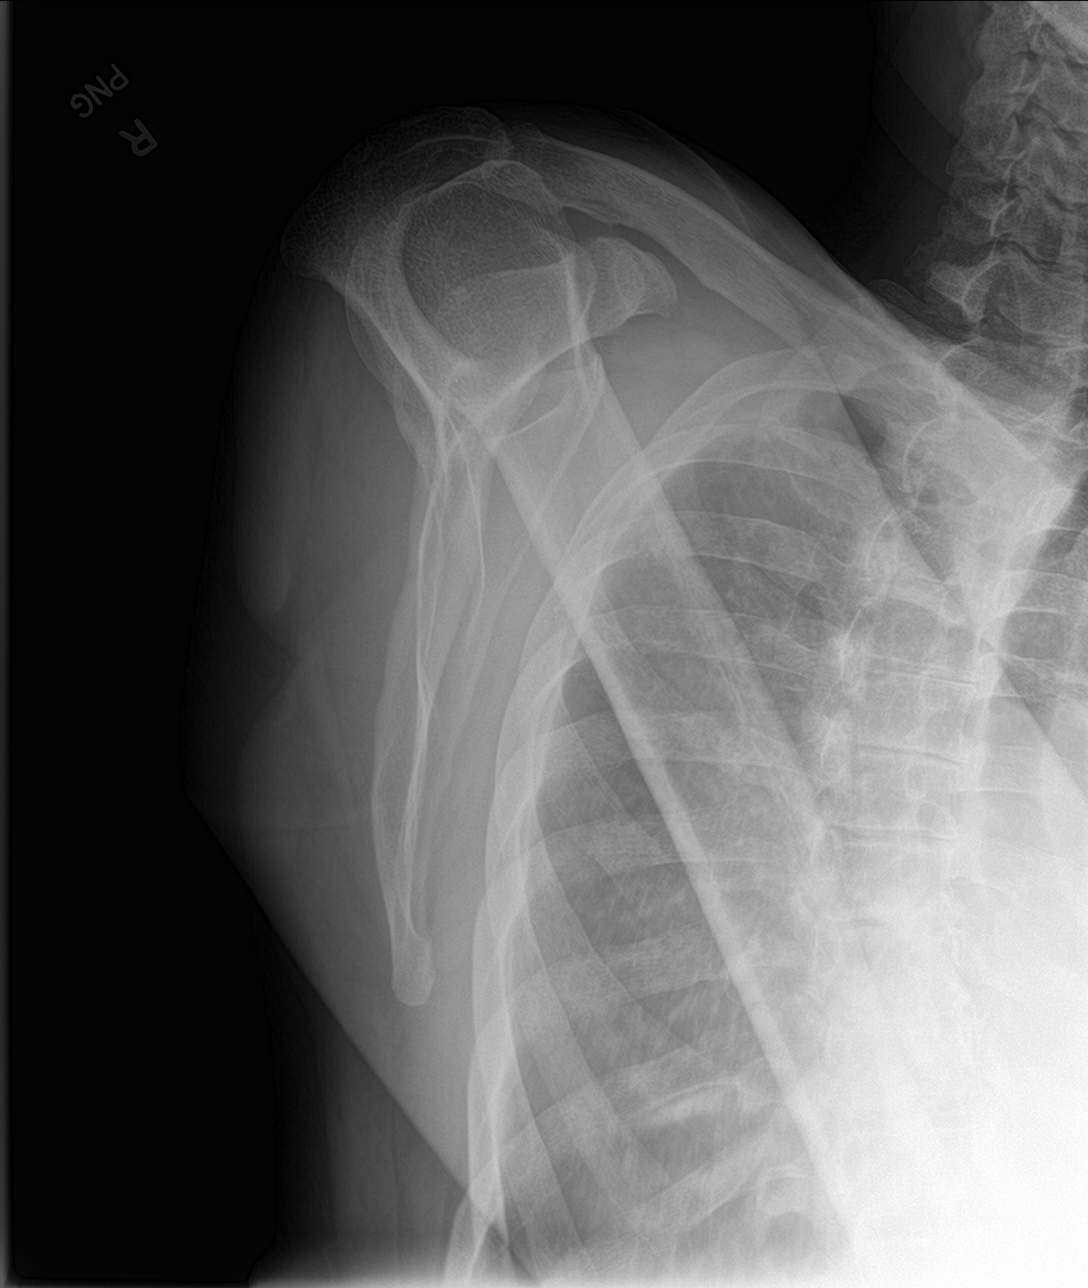

[shoulder axial]
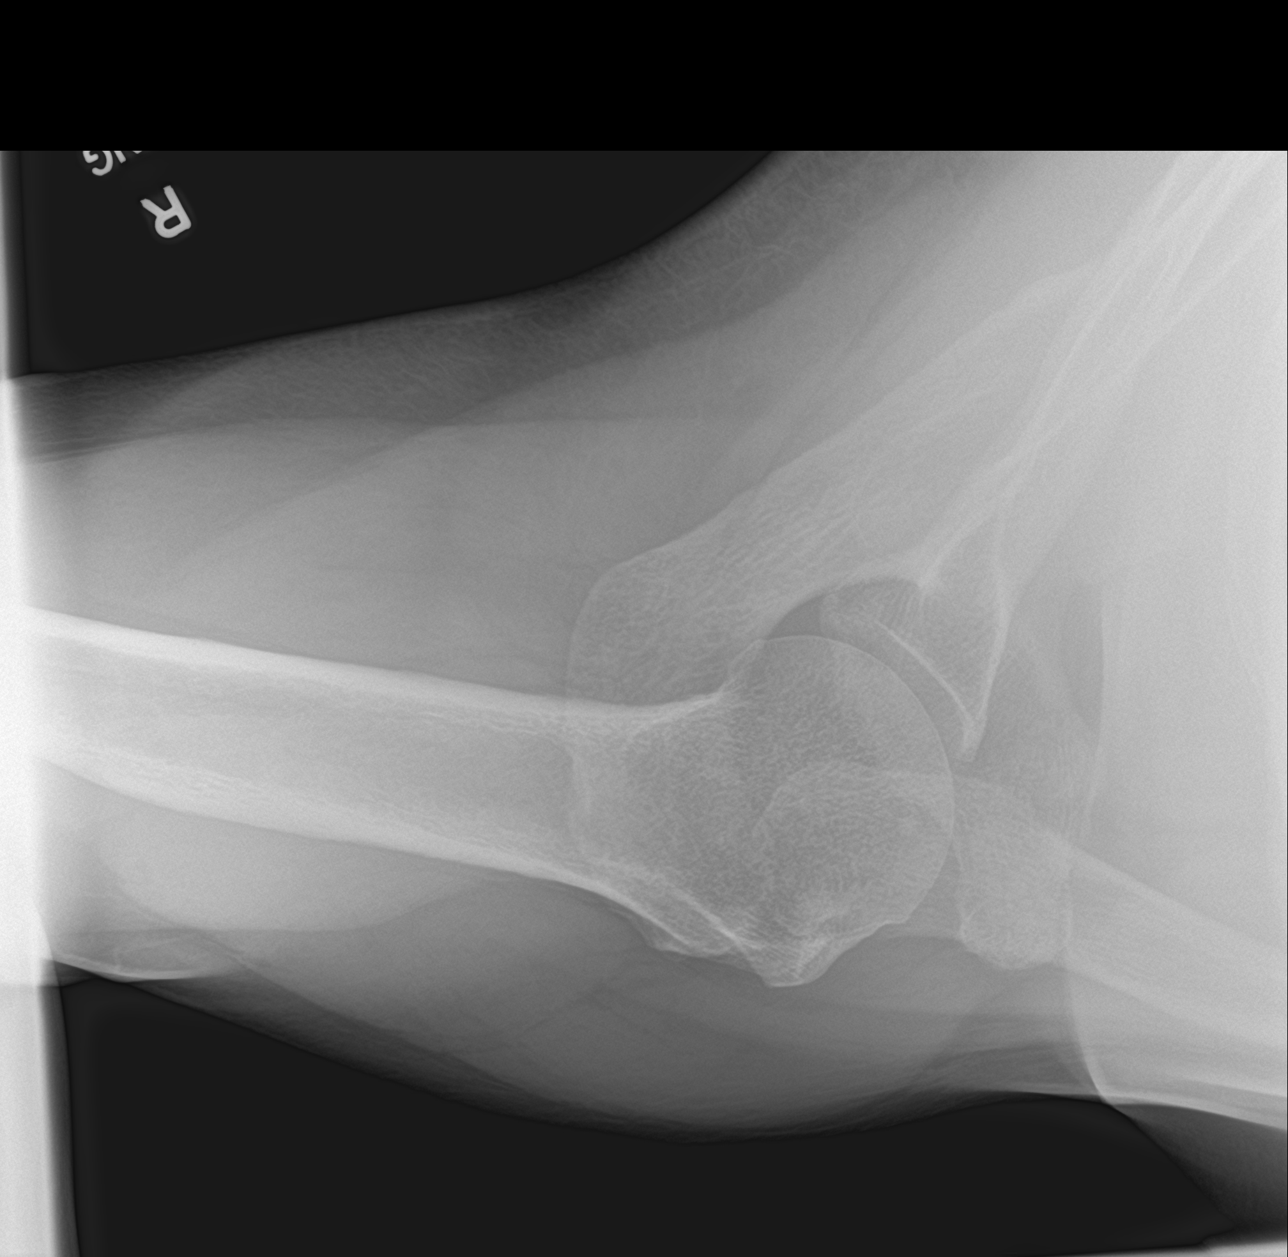

[3 of 3 positions shown; findings below may reference images not displayed]

FINDINGS: Minimal AC joint and glenohumeral joint degenerative changes. No
acute bony findings or abnormal soft tissue calcifications. The
visualized right lung is clear and the visualized right ribs are
intact
IMPRESSION: Minimal degenerative changes.  No acute bony findings.

## 2020-10-01 ENCOUNTER — Telehealth: Payer: Self-pay | Admitting: Family

## 2020-10-01 DIAGNOSIS — R059 Cough, unspecified: Secondary | ICD-10-CM

## 2020-10-01 NOTE — Progress Notes (Signed)
I called patient at 9:30 am for scheduled phone call. He noted he was in the ER with his wife and wanted to call back and re-schedule at later date;

## 2020-10-02 ENCOUNTER — Other Ambulatory Visit: Payer: BC Managed Care – PPO

## 2020-12-05 ENCOUNTER — Encounter: Payer: Self-pay | Admitting: Internal Medicine

## 2020-12-24 ENCOUNTER — Other Ambulatory Visit: Payer: Self-pay

## 2020-12-27 ENCOUNTER — Ambulatory Visit (INDEPENDENT_AMBULATORY_CARE_PROVIDER_SITE_OTHER): Payer: No Typology Code available for payment source | Admitting: Internal Medicine

## 2020-12-27 ENCOUNTER — Encounter: Payer: Self-pay | Admitting: Internal Medicine

## 2020-12-27 ENCOUNTER — Other Ambulatory Visit: Payer: Self-pay

## 2020-12-27 VITALS — BP 140/86 | HR 61 | Temp 98.1°F | Ht >= 80 in | Wt 240.0 lb

## 2020-12-27 DIAGNOSIS — E119 Type 2 diabetes mellitus without complications: Secondary | ICD-10-CM

## 2020-12-27 DIAGNOSIS — Z23 Encounter for immunization: Secondary | ICD-10-CM

## 2020-12-27 DIAGNOSIS — Z1211 Encounter for screening for malignant neoplasm of colon: Secondary | ICD-10-CM | POA: Diagnosis not present

## 2020-12-27 DIAGNOSIS — E538 Deficiency of other specified B group vitamins: Secondary | ICD-10-CM

## 2020-12-27 DIAGNOSIS — I10 Essential (primary) hypertension: Secondary | ICD-10-CM

## 2020-12-27 DIAGNOSIS — R030 Elevated blood-pressure reading, without diagnosis of hypertension: Secondary | ICD-10-CM

## 2020-12-27 DIAGNOSIS — E7800 Pure hypercholesterolemia, unspecified: Secondary | ICD-10-CM | POA: Diagnosis not present

## 2020-12-27 DIAGNOSIS — Z0001 Encounter for general adult medical examination with abnormal findings: Secondary | ICD-10-CM

## 2020-12-27 DIAGNOSIS — Z1159 Encounter for screening for other viral diseases: Secondary | ICD-10-CM | POA: Diagnosis not present

## 2020-12-27 DIAGNOSIS — Z0000 Encounter for general adult medical examination without abnormal findings: Secondary | ICD-10-CM | POA: Diagnosis not present

## 2020-12-27 DIAGNOSIS — E559 Vitamin D deficiency, unspecified: Secondary | ICD-10-CM

## 2020-12-27 LAB — URINALYSIS, ROUTINE W REFLEX MICROSCOPIC
Bilirubin Urine: NEGATIVE
Hgb urine dipstick: NEGATIVE
Ketones, ur: NEGATIVE
Leukocytes,Ua: NEGATIVE
Nitrite: NEGATIVE
RBC / HPF: NONE SEEN (ref 0–?)
Specific Gravity, Urine: 1.025 (ref 1.000–1.030)
Total Protein, Urine: NEGATIVE
Urine Glucose: NEGATIVE
Urobilinogen, UA: 1 (ref 0.0–1.0)
pH: 5.5 (ref 5.0–8.0)

## 2020-12-27 LAB — LIPID PANEL
Cholesterol: 216 mg/dL — ABNORMAL HIGH (ref 0–200)
HDL: 38.7 mg/dL — ABNORMAL LOW (ref >39.00)
LDL Cholesterol: 152 mg/dL — ABNORMAL HIGH (ref 0–99)
NonHDL: 177.41
Total CHOL/HDL Ratio: 6
Triglycerides: 128 mg/dL (ref 0.0–149.0)
VLDL: 25.6 mg/dL (ref 0.0–40.0)

## 2020-12-27 LAB — BASIC METABOLIC PANEL
BUN: 11 mg/dL (ref 6–23)
CO2: 22 mEq/L (ref 19–32)
Calcium: 9.3 mg/dL (ref 8.4–10.5)
Chloride: 109 mEq/L (ref 96–112)
Creatinine, Ser: 0.83 mg/dL (ref 0.40–1.50)
GFR: 93.19 mL/min (ref >60.00)
Glucose, Bld: 89 mg/dL (ref 70–99)
Potassium: 3.9 mEq/L (ref 3.5–5.1)
Sodium: 139 mEq/L (ref 135–145)

## 2020-12-27 LAB — PSA: PSA: 0.44 ng/mL (ref 0.10–4.00)

## 2020-12-27 LAB — CBC WITH DIFFERENTIAL/PLATELET
Basophils Absolute: 0.1 10*3/uL (ref 0.0–0.1)
Basophils Relative: 0.9 % (ref 0.0–3.0)
Eosinophils Absolute: 0.3 10*3/uL (ref 0.0–0.7)
Eosinophils Relative: 3.1 % (ref 0.0–5.0)
HCT: 43.9 % (ref 39.0–52.0)
Hemoglobin: 14.7 g/dL (ref 13.0–17.0)
Lymphocytes Relative: 34.3 % (ref 12.0–46.0)
Lymphs Abs: 2.9 10*3/uL (ref 0.7–4.0)
MCHC: 33.5 g/dL (ref 30.0–36.0)
MCV: 86.8 fl (ref 78.0–100.0)
Monocytes Absolute: 0.6 10*3/uL (ref 0.1–1.0)
Monocytes Relative: 6.9 % (ref 3.0–12.0)
Neutro Abs: 4.6 10*3/uL (ref 1.4–7.7)
Neutrophils Relative %: 54.8 % (ref 43.0–77.0)
Platelets: 222 10*3/uL (ref 150.0–400.0)
RBC: 5.06 Mil/uL (ref 4.22–5.81)
RDW: 15 % (ref 11.5–15.5)
WBC: 8.4 10*3/uL (ref 4.0–10.5)

## 2020-12-27 LAB — HEPATIC FUNCTION PANEL
ALT: 17 U/L (ref 0–53)
AST: 22 U/L (ref 0–37)
Albumin: 4 g/dL (ref 3.5–5.2)
Alkaline Phosphatase: 81 U/L (ref 39–117)
Bilirubin, Direct: 0.1 mg/dL (ref 0.0–0.3)
Total Bilirubin: 0.6 mg/dL (ref 0.2–1.2)
Total Protein: 6.5 g/dL (ref 6.0–8.3)

## 2020-12-27 LAB — VITAMIN D 25 HYDROXY (VIT D DEFICIENCY, FRACTURES): VITD: 7.54 ng/mL — ABNORMAL LOW (ref 30.00–100.00)

## 2020-12-27 LAB — TSH: TSH: 1.36 u[IU]/mL (ref 0.35–4.50)

## 2020-12-27 LAB — MICROALBUMIN / CREATININE URINE RATIO
Creatinine,U: 213.3 mg/dL
Microalb Creat Ratio: 0.3 mg/g (ref 0.0–30.0)
Microalb, Ur: <0.7 mg/dL (ref 0.0–1.9)

## 2020-12-27 LAB — VITAMIN B12: Vitamin B-12: 301 pg/mL (ref 211–911)

## 2020-12-27 LAB — HEMOGLOBIN A1C: Hgb A1c MFr Bld: 6.5 % (ref 4.6–6.5)

## 2020-12-27 MED ORDER — VARENICLINE TARTRATE 1 MG PO TABS: 1.0000 mg | ORAL_TABLET | Freq: Two times a day (BID) | ORAL | 1 refills | Status: DC

## 2020-12-27 MED ORDER — LOSARTAN POTASSIUM 50 MG PO TABS
50.0000 mg | ORAL_TABLET | Freq: Every day | ORAL | 3 refills | Status: DC
Start: 2020-12-27 — End: 2021-08-25

## 2020-12-27 MED ORDER — THERA-D 2000 50 MCG (2000 UT) PO TABS: ORAL_TABLET | ORAL | 99 refills | Status: DC

## 2020-12-27 MED ORDER — CHANTIX STARTING MONTH PAK 0.5 MG X 11 & 1 MG X 42 PO TABS: ORAL_TABLET | ORAL | 0 refills | Status: DC

## 2020-12-27 MED ORDER — ATORVASTATIN CALCIUM 20 MG PO TABS
20.0000 mg | ORAL_TABLET | Freq: Every day | ORAL | 3 refills | Status: DC
Start: 2020-12-27 — End: 2021-08-25

## 2020-12-27 MED ORDER — SILDENAFIL CITRATE 100 MG PO TABS: 50.0000 mg | ORAL_TABLET | Freq: Every day | ORAL | 11 refills | Status: DC | PRN

## 2020-12-27 NOTE — Patient Instructions (Addendum)
You had the Prevnar 13 pneumonia shot today  Please take OTC Vitamin D3 at 2000 units per day, indefinitely.  Please take all new medication as prescribed - the losartan 50 mg per day for blood pressure, and the lipitor 10 mg per day for cholesterol, and chantix  Please continue all other medications as before, and refills have been done if requested.  Please have the pharmacy call with any other refills you may need.  Please continue your efforts at being more active, low cholesterol diet, and weight control.  You are otherwise up to date with prevention measures  Please keep your appointments with your specialists as you may have planned  You will be contacted regarding the referral for: colonoscopy, and eye exam  Please go to the LAB at the blood drawing area for the tests to be done  You will be contacted by phone if any changes need to be made immediately.  Otherwise, you will receive a letter about your results with an explanation, but please check with MyChart first.  Please remember to sign up for MyChart if you have not done so, as this will be important to you in the future with finding out test results, communicating by private email, and scheduling acute appointments online when needed.  Please make an Appointment to return in 6 months, or sooner if needed

## 2020-12-27 NOTE — Progress Notes (Signed)
Patient ID: Shawn Rivera, male   DOB: 09-Mar-1956, 65 y.o.   MRN: [id number]         Chief Complaint:: wellness exam and smoking, dm, low vit d, new onset htn, and hld       HPI:  Shawn Rivera is a 65 y.o. male here for wellness exam; due for prevnar and colonoscopy, o/w up to date with preventive referrals and immunizations.  Trying to quit smoking - asks for chantix to help, as did not take last yr.  Wife is with him and not taking no for an answer this time. Also due to eye exam, needs referral.                  Also not taking vit d, or lipitor as per last exam 2021.  Pt denies chest pain, increased sob or doe, wheezing, orthopnea, PND, increased LE swelling, palpitations, dizziness or syncope.   Pt denies polydipsia, polyuria, denies focal neuro s/s.   Pt denies fever, wt loss, night sweats, loss of appetite, or other constitutional symptoms  No other new complaints  Wt Readings from Last 3 Encounters:  12/27/20 240 lb (108.9 kg)  12/05/19 242 lb (109.8 kg)  12/06/18 271 lb (122.9 kg)   BP Readings from Last 3 Encounters:  12/27/20 140/86  12/05/19 140/90  12/06/18 132/90   Immunization History  Administered Date(s) Administered  . Influenza-Unspecified 06/26/2013, 06/14/2014, 06/13/2015, 06/25/2016, 06/17/2017, 07/04/2018, 06/16/2019  . PFIZER(Purple Top)SARS-COV-2 Vaccination 10/10/2019, 11/21/2019, 12/03/2020  . Pneumococcal Conjugate-13 12/27/2020  . Tdap 11/07/2018   Health Maintenance Due  Topic Date Due  . PNEUMOCOCCAL POLYSACCHARIDE VACCINE AGE 27-64 HIGH RISK  Never done  . OPHTHALMOLOGY EXAM  Never done  . COLONOSCOPY (Pts 45-45yrs Insurance coverage will need to be confirmed)  Never done      Past Medical History:  Diagnosis Date  . Enlarged testicle 12/05/2019  . Erectile dysfunction 12/05/2019  . HLD (hyperlipidemia) 12/07/2019  . Smoker   . Type 2 diabetes mellitus without complication, without long-term current use of insulin (HCC) 12/06/2018  . Vitamin D  deficiency 12/07/2019   History reviewed. No pertinent surgical history.  reports that he has been smoking. He has never used smokeless tobacco. He reports current alcohol use of about 1.0 standard drink of alcohol per week. He reports that he does not use drugs. family history includes Heart disease in his mother; Hypertension in his mother. No Known Allergies No current outpatient medications on file prior to visit.   No current facility-administered medications on file prior to visit.        ROS:  All others reviewed and negative.  Objective        PE:  BP 140/86 (BP Location: Right Arm, Patient Position: Sitting, Cuff Size: Normal)   Pulse 61   Temp 98.1 F (36.7 C) (Oral)   Ht 6\' 8"  (2.032 m)   Wt 240 lb (108.9 kg)   SpO2 97%   BMI 26.37 kg/m                 Constitutional: Pt appears in NAD               HENT: Head: NCAT.                Right Ear: External ear normal.                 Left Ear: External ear normal.  Eyes: . Pupils are equal, round, and reactive to light. Conjunctivae and EOM are normal               Nose: without d/c or deformity               Neck: Neck supple. Gross normal ROM               Cardiovascular: Normal rate and regular rhythm.                 Pulmonary/Chest: Effort normal and breath sounds without rales or wheezing.                Abd:  Soft, NT, ND, + BS, no organomegaly               Neurological: Pt is alert. At baseline orientation, motor grossly intact               Skin: Skin is warm. No rashes, no other new lesions, LE edema - none               Psychiatric: Pt behavior is normal without agitation   Micro: none  Cardiac tracings I have personally interpreted today:  none  Pertinent Radiological findings (summarize): none   Lab Results  Component Value Date   WBC 8.4 12/27/2020   HGB 14.7 12/27/2020   HCT 43.9 12/27/2020   PLT 222.0 12/27/2020   GLUCOSE 89 12/27/2020   CHOL 216 (H) 12/27/2020   TRIG 128.0  12/27/2020   HDL 38.70 (L) 12/27/2020   LDLCALC 152 (H) 12/27/2020   ALT 17 12/27/2020   AST 22 12/27/2020   NA 139 12/27/2020   K 3.9 12/27/2020   CL 109 12/27/2020   CREATININE 0.83 12/27/2020   BUN 11 12/27/2020   CO2 22 12/27/2020   TSH 1.36 12/27/2020   PSA 0.44 12/27/2020   HGBA1C 6.5 12/27/2020   MICROALBUR <0.7 12/27/2020   Assessment/Plan:  Shawn Rivera is a 65 y.o. Black or African American [2] male with  has a past medical history of Enlarged testicle (12/05/2019), Erectile dysfunction (12/05/2019), HLD (hyperlipidemia) (12/07/2019), Smoker, Type 2 diabetes mellitus without complication, without long-term current use of insulin (HCC) (12/06/2018), and Vitamin D deficiency (12/07/2019).  Encounter for well adult exam with abnormal findings Age and sex appropriate education and counseling updated with regular exercise and diet Referrals for preventative services - for colonoscopy, and eye exam referral Immunizations addressed - for prevnar 13 Smoking counseling  - counseled to quit, also for chantix asd Evidence for depression or other mood disorder - none significant Most recent labs reviewed. I have personally reviewed and have noted: 1) the patient's medical and social history 2) The patient's current medications and supplements 3) The patient's height, weight, and BMI have been recorded in the chart   Vitamin D deficiency Last vitamin D Lab Results  Component Value Date   VD25OH 7.54 (L) 12/27/2020   Low, to start oral replacement  Type 2 diabetes mellitus without complication, without long-term current use of insulin (HCC) Lab Results  Component Value Date   HGBA1C 6.5 12/27/2020   Stable, pt to continue current medical treatment  - diet and wt control   HLD (hyperlipidemia) Lab Results  Component Value Date   LDLCALC 152 (H) 12/27/2020   Stable, pt to start current statin  - lipitor  Hypertension BP Readings from Last 3 Encounters:  12/27/20 140/86   12/05/19 140/90  12/06/18 132/90   Mild  increased, to start medical treatment losartan 50 qd   Current Outpatient Medications (Cardiovascular):  .  losartan (COZAAR) 50 MG tablet, Take 1 tablet (50 mg total) by mouth daily. Marland Kitchen  atorvastatin (LIPITOR) 20 MG tablet, Take 1 tablet (20 mg total) by mouth daily. .  sildenafil (VIAGRA) 100 MG tablet, Take 0.5-1 tablets (50-100 mg total) by mouth daily as needed for erectile dysfunction.     Current Outpatient Medications (Other):  Marland Kitchen  Cholecalciferol (THERA-D 2000) 50 MCG (2000 UT) TABS, 1 tab by mouth once daily .  varenicline (CHANTIX CONTINUING MONTH PAK) 1 MG tablet, Take 1 tablet (1 mg total) by mouth 2 (two) times daily. .  varenicline (CHANTIX STARTING MONTH PAK) 0.5 MG X 11 & 1 MG X 42 tablet, Take one 0.5 mg tablet by mouth once daily for 3 days, then increase to one 0.5 mg tablet twice daily for 4 days, then increase to one 1 mg tablet twice daily.   Followup: Return in about 6 months (around 06/28/2021).  Oliver Barre, MD 12/28/2020 3:59 PM [facility] - [facility]

## 2020-12-28 ENCOUNTER — Encounter: Payer: Self-pay | Admitting: Internal Medicine

## 2020-12-28 NOTE — Assessment & Plan Note (Signed)
Last vitamin D Lab Results  Component Value Date   VD25OH 7.54 (L) 12/27/2020   Low, to start oral replacement

## 2020-12-28 NOTE — Assessment & Plan Note (Signed)
Lab Results  Component Value Date   LDLCALC 152 (H) 12/27/2020   Stable, pt to start current statin  - lipitor

## 2020-12-28 NOTE — Assessment & Plan Note (Signed)
BP Readings from Last 3 Encounters:  12/27/20 140/86  12/05/19 140/90  12/06/18 132/90   Mild increased, to start medical treatment losartan 50 qd   Current Outpatient Medications (Cardiovascular):  .  losartan (COZAAR) 50 MG tablet, Take 1 tablet (50 mg total) by mouth daily. Marland Kitchen  atorvastatin (LIPITOR) 20 MG tablet, Take 1 tablet (20 mg total) by mouth daily. .  sildenafil (VIAGRA) 100 MG tablet, Take 0.5-1 tablets (50-100 mg total) by mouth daily as needed for erectile dysfunction.     Current Outpatient Medications (Other):  Marland Kitchen  Cholecalciferol (THERA-D 2000) 50 MCG (2000 UT) TABS, 1 tab by mouth once daily .  varenicline (CHANTIX CONTINUING MONTH PAK) 1 MG tablet, Take 1 tablet (1 mg total) by mouth 2 (two) times daily. .  varenicline (CHANTIX STARTING MONTH PAK) 0.5 MG X 11 & 1 MG X 42 tablet, Take one 0.5 mg tablet by mouth once daily for 3 days, then increase to one 0.5 mg tablet twice daily for 4 days, then increase to one 1 mg tablet twice daily.

## 2020-12-28 NOTE — Assessment & Plan Note (Signed)
Age and sex appropriate education and counseling updated with regular exercise and diet Referrals for preventative services - for colonoscopy, and eye exam referral Immunizations addressed - for prevnar 13 Smoking counseling  - counseled to quit, also for chantix asd Evidence for depression or other mood disorder - none significant Most recent labs reviewed. I have personally reviewed and have noted: 1) the patient's medical and social history 2) The patient's current medications and supplements 3) The patient's height, weight, and BMI have been recorded in the chart

## 2020-12-28 NOTE — Assessment & Plan Note (Signed)
Lab Results  Component Value Date   HGBA1C 6.5 12/27/2020   Stable, pt to continue current medical treatment  - diet and wt control

## 2021-01-13 ENCOUNTER — Encounter: Payer: Self-pay | Admitting: Internal Medicine

## 2021-06-30 ENCOUNTER — Ambulatory Visit: Payer: No Typology Code available for payment source | Admitting: Internal Medicine

## 2021-08-11 ENCOUNTER — Ambulatory Visit: Payer: Self-pay | Admitting: Internal Medicine

## 2021-08-25 ENCOUNTER — Other Ambulatory Visit: Payer: Self-pay

## 2021-08-25 ENCOUNTER — Encounter: Payer: Self-pay | Admitting: Internal Medicine

## 2021-08-25 ENCOUNTER — Ambulatory Visit (INDEPENDENT_AMBULATORY_CARE_PROVIDER_SITE_OTHER): Payer: 59 | Admitting: Internal Medicine

## 2021-08-25 ENCOUNTER — Other Ambulatory Visit (HOSPITAL_COMMUNITY): Payer: Self-pay

## 2021-08-25 VITALS — BP 118/70 | HR 66 | Temp 98.4°F | Ht >= 80 in | Wt 257.0 lb

## 2021-08-25 DIAGNOSIS — E7800 Pure hypercholesterolemia, unspecified: Secondary | ICD-10-CM | POA: Diagnosis not present

## 2021-08-25 DIAGNOSIS — F17200 Nicotine dependence, unspecified, uncomplicated: Secondary | ICD-10-CM

## 2021-08-25 DIAGNOSIS — E559 Vitamin D deficiency, unspecified: Secondary | ICD-10-CM

## 2021-08-25 DIAGNOSIS — I10 Essential (primary) hypertension: Secondary | ICD-10-CM

## 2021-08-25 DIAGNOSIS — E119 Type 2 diabetes mellitus without complications: Secondary | ICD-10-CM | POA: Diagnosis not present

## 2021-08-25 DIAGNOSIS — J449 Chronic obstructive pulmonary disease, unspecified: Secondary | ICD-10-CM

## 2021-08-25 DIAGNOSIS — Z1211 Encounter for screening for malignant neoplasm of colon: Secondary | ICD-10-CM

## 2021-08-25 LAB — POCT GLYCOSYLATED HEMOGLOBIN (HGB A1C): Hemoglobin A1C: 6.7 % — AB (ref 4.0–5.6)

## 2021-08-25 MED ORDER — ATORVASTATIN CALCIUM 20 MG PO TABS
20.0000 mg | ORAL_TABLET | Freq: Every day | ORAL | 3 refills | Status: DC
  Filled 2021-08-25 – 2021-09-09 (×2): qty 90, 90d supply, fill #0

## 2021-08-25 MED ORDER — ALBUTEROL SULFATE HFA 108 (90 BASE) MCG/ACT IN AERS
2.0000 | INHALATION_SPRAY | Freq: Four times a day (QID) | RESPIRATORY_TRACT | 11 refills | Status: DC | PRN
  Filled 2021-08-25 – 2021-09-09 (×2): qty 8.5, 25d supply, fill #0

## 2021-08-25 MED ORDER — THERA-D 2000 50 MCG (2000 UT) PO TABS: ORAL_TABLET | ORAL | 99 refills | Status: AC

## 2021-08-25 NOTE — Patient Instructions (Signed)
Your A1c was done today  Please take all new medication as prescribed - the albuterol inhaler as needed, and the lipitor for cholesterol  Please quit smoking  You will be contacted regarding the referral for: pulmonary for the "Pulmonary Function tests" and the LDCT program (low dose CT scan program yearly), AND the colonoscopy  Please take OTC Vitamin D3 at 2000 units per day, indefinitely  Please continue all other medications as before, and refills have been done if requested.  Please have the pharmacy call with any other refills you may need.  Please continue your efforts at being more active, low cholesterol diet, and weight control.  Please keep your appointments with your specialists as you may have planned  Please make an Appointment to return in 6 months, or sooner if needed

## 2021-08-25 NOTE — Assessment & Plan Note (Signed)
Last vitamin D Lab Results  Component Value Date   VD25OH 7.54 (L) 12/27/2020   Lo, to start oral replacement

## 2021-08-25 NOTE — Progress Notes (Signed)
Patient ID: Shawn Rivera, male   DOB: Aug 18, 1956, 65 y.o.   MRN: [id number]       Chief Complaint: follow up HTN, HLD and hyperglycemia, vit d, smoker       HPI:  Shawn Rivera is a 65 y.o. male here to f/u with wife; Pt denies chest pain, increased sob or doe, wheezing, orthopnea, PND, increased LE swelling, palpitations, dizziness or syncope, but does have occasional dyspnea and believes he may have copd, asks for evaluation.  Not taking Vit D.  Still smoking, not ready to quit, but interested in LDCT screening.  Trying to follow low chol diet, not taking lipitor, but now willing to start.  Also due for colonoscopy.   Pt denies fever, wt loss, night sweats, loss of appetite, or other constitutional symptoms   Pt denies polydipsia, polyuria, or new focal neuro s/s.         Wt Readings from Last 3 Encounters:  08/25/21 257 lb (116.6 kg)  12/27/20 240 lb (108.9 kg)  12/05/19 242 lb (109.8 kg)   BP Readings from Last 3 Encounters:  08/25/21 118/70  12/27/20 140/86  12/05/19 140/90         Past Medical History:  Diagnosis Date   Enlarged testicle 12/05/2019   Erectile dysfunction 12/05/2019   HLD (hyperlipidemia) 12/07/2019   Smoker    Type 2 diabetes mellitus without complication, without long-term current use of insulin (HCC) 12/06/2018   Vitamin D deficiency 12/07/2019   History reviewed. No pertinent surgical history.  reports that he has been smoking. He has never used smokeless tobacco. He reports current alcohol use of about 1.0 standard drink per week. He reports that he does not use drugs. family history includes Heart disease in his mother; Hypertension in his mother. No Known Allergies Current Outpatient Medications on File Prior to Visit  Medication Sig Dispense Refill   sildenafil (VIAGRA) 100 MG tablet Take 0.5-1 tablets (50-100 mg total) by mouth daily as needed for erectile dysfunction. (Patient not taking: Reported on 08/25/2021) 5 tablet 11   varenicline (CHANTIX  CONTINUING MONTH PAK) 1 MG tablet Take 1 tablet (1 mg total) by mouth 2 (two) times daily. (Patient not taking: Reported on 08/25/2021) 60 tablet 1   varenicline (CHANTIX STARTING MONTH PAK) 0.5 MG X 11 & 1 MG X 42 tablet Take one 0.5 mg tablet by mouth once daily for 3 days, then increase to one 0.5 mg tablet twice daily for 4 days, then increase to one 1 mg tablet twice daily. (Patient not taking: Reported on 08/25/2021) 53 tablet 0   No current facility-administered medications on file prior to visit.        ROS:  All others reviewed and negative.  Objective        PE:  BP 118/70 (BP Location: Left Arm, Patient Position: Sitting, Cuff Size: Large)   Pulse 66   Temp 98.4 F (36.9 C) (Oral)   Ht 6\' 8"  (2.032 m)   Wt 257 lb (116.6 kg)   SpO2 94%   BMI 28.23 kg/m                 Constitutional: Pt appears in NAD               HENT: Head: NCAT.                Right Ear: External ear normal.                 Left  Ear: External ear normal.                Eyes: . Pupils are equal, round, and reactive to light. Conjunctivae and EOM are normal               Nose: without d/c or deformity               Neck: Neck supple. Gross normal ROM               Cardiovascular: Normal rate and regular rhythm.                 Pulmonary/Chest: Effort normal and breath sounds without rales or wheezing.                Abd:  Soft, NT, ND, + BS, no organomegaly               Neurological: Pt is alert. At baseline orientation, motor grossly intact               Skin: Skin is warm. No rashes, no other new lesions, LE edema - none               Psychiatric: Pt behavior is normal without agitation   Micro: none  Cardiac tracings I have personally interpreted today:  none  Pertinent Radiological findings (summarize): none   Lab Results  Component Value Date   WBC 8.4 12/27/2020   HGB 14.7 12/27/2020   HCT 43.9 12/27/2020   PLT 222.0 12/27/2020   GLUCOSE 89 12/27/2020   CHOL 216 (H) 12/27/2020   TRIG  128.0 12/27/2020   HDL 38.70 (L) 12/27/2020   LDLCALC 152 (H) 12/27/2020   ALT 17 12/27/2020   AST 22 12/27/2020   NA 139 12/27/2020   K 3.9 12/27/2020   CL 109 12/27/2020   CREATININE 0.83 12/27/2020   BUN 11 12/27/2020   CO2 22 12/27/2020   TSH 1.36 12/27/2020   PSA 0.44 12/27/2020   HGBA1C 6.7 (A) 08/25/2021   MICROALBUR <0.7 12/27/2020   Hemoglobin A1C 4.0 - 5.6 % 6.7 Abnormal   6.5 R, CM  6.4 R, CM  7.3 High    Assessment/Plan:  Shawn Rivera is a 65 y.o. Black or African American [2] male with  has a past medical history of Enlarged testicle (12/05/2019), Erectile dysfunction (12/05/2019), HLD (hyperlipidemia) (12/07/2019), Smoker, Type 2 diabetes mellitus without complication, without long-term current use of insulin (HCC) (12/06/2018), and Vitamin D deficiency (12/07/2019).  Vitamin D deficiency Last vitamin D Lab Results  Component Value Date   VD25OH 7.54 (L) 12/27/2020   Lo, to start oral replacement   Hypertension BP Readings from Last 3 Encounters:  08/25/21 118/70  12/27/20 140/86  12/05/19 140/90   Stable, pt to continue medical treatment  - diet, low salt, exercise, wt control   Type 2 diabetes mellitus without complication, without long-term current use of insulin (HCC) Lab Results  Component Value Date   HGBA1C 6.7 (A) 08/25/2021   Mild uncontrolled, goal a1c < 7, pt to continue current medical treatment as declines new metformin for now   HLD (hyperlipidemia) Lab Results  Component Value Date   LDLCALC 152 (H) 12/27/2020   Severe uncontrolled, goal ldl < 70, pt to restart lipitor 20 qd, low chol DM diet   Smoker Pt counseled to quit, pt not ready, ok for refer pulm for LDCT screening, and Albuterol hfa prn trial, also for PFTs Followup: Return in about  6 months (around 02/22/2022).  Oliver Barre, MD 08/27/2021 5:06 AM [facility] - [facility]

## 2021-08-26 ENCOUNTER — Other Ambulatory Visit (HOSPITAL_COMMUNITY): Payer: Self-pay

## 2021-08-27 ENCOUNTER — Encounter: Payer: Self-pay | Admitting: Internal Medicine

## 2021-08-27 NOTE — Assessment & Plan Note (Signed)
Lab Results  Component Value Date   HGBA1C 6.7 (A) 08/25/2021   Mild uncontrolled, goal a1c < 7, pt to continue current medical treatment as declines new metformin for now

## 2021-08-27 NOTE — Assessment & Plan Note (Addendum)
Pt counseled to quit, pt not ready, ok for refer pulm for LDCT screening, and Albuterol hfa prn trial, also for PFTs

## 2021-08-27 NOTE — Assessment & Plan Note (Signed)
BP Readings from Last 3 Encounters:  08/25/21 118/70  12/27/20 140/86  12/05/19 140/90   Stable, pt to continue medical treatment  - diet, low salt, exercise, wt control

## 2021-08-27 NOTE — Assessment & Plan Note (Signed)
Lab Results  Component Value Date   LDLCALC 152 (H) 12/27/2020   Severe uncontrolled, goal ldl < 70, pt to restart lipitor 20 qd, low chol DM diet

## 2021-09-02 ENCOUNTER — Other Ambulatory Visit (HOSPITAL_COMMUNITY): Payer: Self-pay

## 2021-09-04 ENCOUNTER — Other Ambulatory Visit: Payer: Self-pay

## 2021-09-04 ENCOUNTER — Encounter: Payer: Self-pay | Admitting: Internal Medicine

## 2021-09-04 ENCOUNTER — Ambulatory Visit (INDEPENDENT_AMBULATORY_CARE_PROVIDER_SITE_OTHER): Payer: 59 | Admitting: Internal Medicine

## 2021-09-04 VITALS — BP 120/76 | HR 63 | Temp 99.1°F | Ht >= 80 in | Wt 258.0 lb

## 2021-09-04 DIAGNOSIS — I10 Essential (primary) hypertension: Secondary | ICD-10-CM

## 2021-09-04 DIAGNOSIS — N50811 Right testicular pain: Secondary | ICD-10-CM

## 2021-09-04 DIAGNOSIS — E119 Type 2 diabetes mellitus without complications: Secondary | ICD-10-CM

## 2021-09-04 DIAGNOSIS — F17200 Nicotine dependence, unspecified, uncomplicated: Secondary | ICD-10-CM

## 2021-09-04 DIAGNOSIS — N529 Male erectile dysfunction, unspecified: Secondary | ICD-10-CM

## 2021-09-04 MED ORDER — SILDENAFIL CITRATE 100 MG PO TABS: 50.0000 mg | ORAL_TABLET | Freq: Every day | ORAL | 11 refills | Status: DC | PRN

## 2021-09-04 NOTE — Progress Notes (Signed)
Patient ID: Shawn Rivera, male   DOB: 1956/09/06, 65 y.o.   MRN: [id number]        Chief Complaint: follow up right testicle swelling, ED       HPI:  Shawn Rivera is a 65 y.o. male here with c/o 1 mo onset mild discomfort to right scrotal area, constant, dull, worse to stand, walk or turn over at night, better to not do these, no truama or fever, and Denies urinary symptoms such as dysuria, frequency, urgency, flank pain, hematuria or n/v,  chills.   Also with 6 mo worsening Ed symptoms of inability to maintain erection.  Pt denies chest pain, increased sob or doe, wheezing, orthopnea, PND, increased LE swelling, palpitations, dizziness or syncope.   Pt denies polydipsia, polyuria, or new focal neuro s/s.       Due for flu shot Wt Readings from Last 3 Encounters:  09/04/21 258 lb (117 kg)  08/25/21 257 lb (116.6 kg)  12/27/20 240 lb (108.9 kg)   BP Readings from Last 3 Encounters:  09/04/21 120/76  08/25/21 118/70  12/27/20 140/86         Past Medical History:  Diagnosis Date   Enlarged testicle 12/05/2019   Erectile dysfunction 12/05/2019   HLD (hyperlipidemia) 12/07/2019   Smoker    Type 2 diabetes mellitus without complication, without long-term current use of insulin ([place]) 12/06/2018   Vitamin D deficiency 12/07/2019   History reviewed. No pertinent surgical history.  reports that he has been smoking. He has never used smokeless tobacco. He reports current alcohol use of about 1.0 standard drink per week. He reports that he does not use drugs. family history includes Heart disease in his mother; Hypertension in his mother. No Known Allergies Current Outpatient Medications on File Prior to Visit  Medication Sig Dispense Refill   albuterol (VENTOLIN HFA) 108 (90 Base) MCG/ACT inhaler Inhale 2 puffs into the lungs every 6 (six) hours as needed for wheezing or shortness of breath. 8 g 11   atorvastatin (LIPITOR) 20 MG tablet Take 1 tablet (20 mg total) by mouth daily. 90 tablet 3    Cholecalciferol (THERA-D 2000) 50 MCG (2000 UT) TABS 1 tab by mouth once daily 30 tablet 99   varenicline (CHANTIX CONTINUING MONTH PAK) 1 MG tablet Take 1 tablet (1 mg total) by mouth 2 (two) times daily. 60 tablet 1   varenicline (CHANTIX STARTING MONTH PAK) 0.5 MG X 11 & 1 MG X 42 tablet Take one 0.5 mg tablet by mouth once daily for 3 days, then increase to one 0.5 mg tablet twice daily for 4 days, then increase to one 1 mg tablet twice daily. 53 tablet 0   No current facility-administered medications on file prior to visit.        ROS:  All others reviewed and negative.  Objective        PE:  BP 120/76 (BP Location: Right Arm, Patient Position: Sitting, Cuff Size: Large)   Pulse 63   Temp 99.1 F (37.3 C) (Oral)   Ht 6\' 8"  (2.032 m)   Wt 258 lb (117 kg)   SpO2 95%   BMI 28.34 kg/m                 Constitutional: Pt appears in NAD               HENT: Head: NCAT.                Right Ear: External ear  normal.                 Left Ear: External ear normal.                Eyes: . Pupils are equal, round, and reactive to light. Conjunctivae and EOM are normal               Nose: without d/c or deformity               Neck: Neck supple. Gross normal ROM               Cardiovascular: Normal rate and regular rhythm.                 Pulmonary/Chest: Effort normal and breath sounds without rales or wheezing.                Abd:  Soft, NT, ND, + BS, no organomegaly; GU with right scrotum with moderately large diffuse swelling about the right testicle, mild tender with no scrotal skin changes, left testicle normal size and nontender               Neurological: Pt is alert. At baseline orientation, motor grossly intact               Skin: Skin is warm. No rashes, no other new lesions, LE edema - none               Psychiatric: Pt behavior is normal without agitation   Micro: none  Cardiac tracings I have personally interpreted today:  none  Pertinent Radiological findings (summarize):  none   Lab Results  Component Value Date   WBC 8.4 12/27/2020   HGB 14.7 12/27/2020   HCT 43.9 12/27/2020   PLT 222.0 12/27/2020   GLUCOSE 89 12/27/2020   CHOL 216 (H) 12/27/2020   TRIG 128.0 12/27/2020   HDL 38.70 (L) 12/27/2020   LDLCALC 152 (H) 12/27/2020   ALT 17 12/27/2020   AST 22 12/27/2020   NA 139 12/27/2020   K 3.9 12/27/2020   CL 109 12/27/2020   CREATININE 0.83 12/27/2020   BUN 11 12/27/2020   CO2 22 12/27/2020   TSH 1.36 12/27/2020   PSA 0.44 12/27/2020   HGBA1C 6.7 (A) 08/25/2021   MICROALBUR <0.7 12/27/2020   Assessment/Plan:  Shawn Rivera is a 65 y.o. Black or African American [2] male with  has a past medical history of Enlarged testicle (12/05/2019), Erectile dysfunction (12/05/2019), HLD (hyperlipidemia) (12/07/2019), Smoker, Type 2 diabetes mellitus without complication, without long-term current use of insulin (HCC) (12/06/2018), and Vitamin D deficiency (12/07/2019).  Pain in right testicle Mild chronic persistent now, suspect hydrocele, urged pt for scrotal u/s but declines for now  Erectile dysfunction Recent worsening, for viagra prn,  to f/u any worsening symptoms or concerns  Smoker Pt counseled to quit, pt not ready  Type 2 diabetes mellitus without complication, without long-term current use of insulin (HCC) Lab Results  Component Value Date   HGBA1C 6.7 (A) 08/25/2021   Stable, pt to continue current medical treatment - diet   Hypertension BP Readings from Last 3 Encounters:  09/04/21 120/76  08/25/21 118/70  12/27/20 140/86   Stable, pt to continue medical treatment  - diet, wt control, low salt, excercise  Followup: No follow-ups on file.  Oliver Barre, MD 09/09/2021 3:59 AM [facility] - [facility] Internal Medicine

## 2021-09-04 NOTE — Patient Instructions (Signed)
You had the flu shot today  Please take all new medication as prescribed - the viagra as needed  Please continue all other medications as before, and refills have been done if requested.  Please have the pharmacy call with any other refills you may need.  Please continue your efforts at being more active, low cholesterol diet, and weight control.  Please keep your appointments with your specialists as you may have planned

## 2021-09-09 ENCOUNTER — Encounter: Payer: Self-pay | Admitting: Internal Medicine

## 2021-09-09 ENCOUNTER — Other Ambulatory Visit (HOSPITAL_COMMUNITY): Payer: Self-pay

## 2021-09-09 NOTE — Assessment & Plan Note (Signed)
BP Readings from Last 3 Encounters:  09/04/21 120/76  08/25/21 118/70  12/27/20 140/86   Stable, pt to continue medical treatment  - diet, wt control, low salt, excercise

## 2021-09-09 NOTE — Assessment & Plan Note (Signed)
Mild chronic persistent now, suspect hydrocele, urged pt for scrotal u/s but declines for now

## 2021-09-09 NOTE — Assessment & Plan Note (Signed)
Lab Results  Component Value Date   HGBA1C 6.7 (A) 08/25/2021   Stable, pt to continue current medical treatment - diet

## 2021-09-09 NOTE — Assessment & Plan Note (Signed)
Pt counseled to quit, pt not ready 

## 2021-09-09 NOTE — Assessment & Plan Note (Signed)
Recent worsening, for viagra prn,  to f/u any worsening symptoms or concerns 

## 2021-09-17 ENCOUNTER — Other Ambulatory Visit (HOSPITAL_COMMUNITY): Payer: Self-pay

## 2021-10-15 ENCOUNTER — Other Ambulatory Visit (HOSPITAL_COMMUNITY): Payer: Self-pay

## 2021-12-29 ENCOUNTER — Encounter: Payer: Self-pay | Admitting: Internal Medicine

## 2022-02-26 ENCOUNTER — Ambulatory Visit: Payer: 59 | Admitting: Internal Medicine

## 2022-02-26 ENCOUNTER — Ambulatory Visit (INDEPENDENT_AMBULATORY_CARE_PROVIDER_SITE_OTHER): Payer: 59 | Admitting: Internal Medicine

## 2022-02-26 ENCOUNTER — Encounter: Payer: Self-pay | Admitting: Internal Medicine

## 2022-02-26 ENCOUNTER — Other Ambulatory Visit (HOSPITAL_COMMUNITY): Payer: Self-pay

## 2022-02-26 VITALS — BP 136/78 | HR 70 | Ht >= 80 in | Wt 259.0 lb

## 2022-02-26 DIAGNOSIS — F17200 Nicotine dependence, unspecified, uncomplicated: Secondary | ICD-10-CM | POA: Diagnosis not present

## 2022-02-26 DIAGNOSIS — I10 Essential (primary) hypertension: Secondary | ICD-10-CM

## 2022-02-26 DIAGNOSIS — Z0001 Encounter for general adult medical examination with abnormal findings: Secondary | ICD-10-CM

## 2022-02-26 DIAGNOSIS — Z125 Encounter for screening for malignant neoplasm of prostate: Secondary | ICD-10-CM | POA: Diagnosis not present

## 2022-02-26 DIAGNOSIS — E538 Deficiency of other specified B group vitamins: Secondary | ICD-10-CM | POA: Diagnosis not present

## 2022-02-26 DIAGNOSIS — E119 Type 2 diabetes mellitus without complications: Secondary | ICD-10-CM | POA: Diagnosis not present

## 2022-02-26 DIAGNOSIS — Z1211 Encounter for screening for malignant neoplasm of colon: Secondary | ICD-10-CM | POA: Diagnosis not present

## 2022-02-26 DIAGNOSIS — N529 Male erectile dysfunction, unspecified: Secondary | ICD-10-CM

## 2022-02-26 DIAGNOSIS — E559 Vitamin D deficiency, unspecified: Secondary | ICD-10-CM | POA: Diagnosis not present

## 2022-02-26 DIAGNOSIS — Z23 Encounter for immunization: Secondary | ICD-10-CM

## 2022-02-26 DIAGNOSIS — H538 Other visual disturbances: Secondary | ICD-10-CM | POA: Diagnosis not present

## 2022-02-26 DIAGNOSIS — E7800 Pure hypercholesterolemia, unspecified: Secondary | ICD-10-CM | POA: Diagnosis not present

## 2022-02-26 LAB — URINALYSIS, ROUTINE W REFLEX MICROSCOPIC
Bilirubin Urine: NEGATIVE
Hgb urine dipstick: NEGATIVE
Ketones, ur: NEGATIVE
Leukocytes,Ua: NEGATIVE
Nitrite: NEGATIVE
RBC / HPF: NONE SEEN (ref 0–?)
Specific Gravity, Urine: 1.025 (ref 1.000–1.030)
Total Protein, Urine: NEGATIVE
Urine Glucose: NEGATIVE
Urobilinogen, UA: 0.2 (ref 0.0–1.0)
pH: 6 (ref 5.0–8.0)

## 2022-02-26 LAB — CBC WITH DIFFERENTIAL/PLATELET
Basophils Absolute: 0.1 10*3/uL (ref 0.0–0.1)
Basophils Relative: 0.7 % (ref 0.0–3.0)
Eosinophils Absolute: 0.4 10*3/uL (ref 0.0–0.7)
Eosinophils Relative: 3.4 % (ref 0.0–5.0)
HCT: 45.3 % (ref 39.0–52.0)
Hemoglobin: 14.9 g/dL (ref 13.0–17.0)
Lymphocytes Relative: 31.4 % (ref 12.0–46.0)
Lymphs Abs: 3.2 10*3/uL (ref 0.7–4.0)
MCHC: 32.9 g/dL (ref 30.0–36.0)
MCV: 88.4 fl (ref 78.0–100.0)
Monocytes Absolute: 0.7 10*3/uL (ref 0.1–1.0)
Monocytes Relative: 6.6 % (ref 3.0–12.0)
Neutro Abs: 5.9 10*3/uL (ref 1.4–7.7)
Neutrophils Relative %: 57.9 % (ref 43.0–77.0)
Platelets: 243 10*3/uL (ref 150.0–400.0)
RBC: 5.13 Mil/uL (ref 4.22–5.81)
RDW: 14.9 % (ref 11.5–15.5)
WBC: 10.2 10*3/uL (ref 4.0–10.5)

## 2022-02-26 LAB — TSH: TSH: 1.88 u[IU]/mL (ref 0.35–5.50)

## 2022-02-26 LAB — LIPID PANEL
Cholesterol: 206 mg/dL — ABNORMAL HIGH (ref 0–200)
HDL: 36.3 mg/dL — ABNORMAL LOW (ref >39.00)
LDL Cholesterol: 146 mg/dL — ABNORMAL HIGH (ref 0–99)
NonHDL: 169.94
Total CHOL/HDL Ratio: 6
Triglycerides: 118 mg/dL (ref 0.0–149.0)
VLDL: 23.6 mg/dL (ref 0.0–40.0)

## 2022-02-26 LAB — HEPATIC FUNCTION PANEL
ALT: 18 U/L (ref 0–53)
AST: 22 U/L (ref 0–37)
Albumin: 4 g/dL (ref 3.5–5.2)
Alkaline Phosphatase: 90 U/L (ref 39–117)
Bilirubin, Direct: 0.1 mg/dL (ref 0.0–0.3)
Total Bilirubin: 0.7 mg/dL (ref 0.2–1.2)
Total Protein: 6.6 g/dL (ref 6.0–8.3)

## 2022-02-26 LAB — BASIC METABOLIC PANEL
BUN: 10 mg/dL (ref 6–23)
CO2: 23 mEq/L (ref 19–32)
Calcium: 9.4 mg/dL (ref 8.4–10.5)
Chloride: 108 mEq/L (ref 96–112)
Creatinine, Ser: 0.94 mg/dL (ref 0.40–1.50)
GFR: 85.61 mL/min (ref >60.00)
Glucose, Bld: 123 mg/dL — ABNORMAL HIGH (ref 70–99)
Potassium: 4.1 mEq/L (ref 3.5–5.1)
Sodium: 140 mEq/L (ref 135–145)

## 2022-02-26 LAB — VITAMIN D 25 HYDROXY (VIT D DEFICIENCY, FRACTURES): VITD: 9.15 ng/mL — ABNORMAL LOW (ref 30.00–100.00)

## 2022-02-26 LAB — MICROALBUMIN / CREATININE URINE RATIO
Creatinine,U: 199.4 mg/dL
Microalb Creat Ratio: 0.4 mg/g (ref 0.0–30.0)
Microalb, Ur: <0.7 mg/dL (ref 0.0–1.9)

## 2022-02-26 LAB — PSA: PSA: 0.47 ng/mL (ref 0.10–4.00)

## 2022-02-26 LAB — VITAMIN B12: Vitamin B-12: 418 pg/mL (ref 211–911)

## 2022-02-26 LAB — HEMOGLOBIN A1C: Hgb A1c MFr Bld: 7.6 % — ABNORMAL HIGH (ref 4.6–6.5)

## 2022-02-26 MED ORDER — SILDENAFIL CITRATE 100 MG PO TABS
50.0000 mg | ORAL_TABLET | Freq: Every day | ORAL | 11 refills | Status: DC | PRN
  Filled 2022-02-26: qty 10, 10d supply, fill #0

## 2022-02-26 MED ORDER — ATORVASTATIN CALCIUM 20 MG PO TABS
20.0000 mg | ORAL_TABLET | Freq: Every day | ORAL | 3 refills | Status: DC
  Filled 2022-02-26: qty 90, 90d supply, fill #0

## 2022-02-26 MED ORDER — SILDENAFIL CITRATE 100 MG PO TABS
50.0000 mg | ORAL_TABLET | Freq: Every day | ORAL | 11 refills | Status: DC | PRN
  Filled 2022-02-26: qty 10, 30d supply, fill #0
  Filled 2022-05-27: qty 10, 30d supply, fill #1
  Filled 2022-06-29: qty 10, 30d supply, fill #2
  Filled 2022-07-09: qty 10, 30d supply, fill #3
  Filled 2022-09-03: qty 10, 30d supply, fill #4
  Filled 2022-10-10: qty 10, 30d supply, fill #5
  Filled 2022-11-20: qty 10, 30d supply, fill #6

## 2022-02-26 MED ORDER — OZEMPIC (0.25 OR 0.5 MG/DOSE) 2 MG/3ML ~~LOC~~ SOPN
0.5000 mg | PEN_INJECTOR | SUBCUTANEOUS | 3 refills | Status: DC
  Filled 2022-02-26: qty 9, 84d supply, fill #0

## 2022-02-26 NOTE — Assessment & Plan Note (Signed)
Lab Results  Component Value Date   LDLCALC 152 (H) 12/27/2020   uncontrolled, pt to start lipitor 20 qd, f/u lab today, goal ldl < 70

## 2022-02-26 NOTE — Assessment & Plan Note (Signed)
Stable, cont viagra prn 

## 2022-02-26 NOTE — Patient Instructions (Addendum)
You will be contacted regarding the referral for: colonoscopy, eye doctor, and urology  You had the Shingles shot #1 today; so plan to return for Nurse Visit in 2 months for Shot #2 (or it can be done at your next visit in 6 months)  Please take OTC Vitamin D3 at 2000 units per day, indefinitely  Please take all new medication as prescribed -the ozempic, and the lipitor  Please quit smoking as we discussed  Please continue all other medications as before, and refills have been done if requested.  Please have the pharmacy call with any other refills you may need.  Please continue your efforts at being more active, low cholesterol diet, and weight control.  You are otherwise up to date with prevention measures today.  Please keep your appointments with your specialists as you may have planned  Please go to the LAB at the blood drawing area for the tests to be done  You will be contacted by phone if any changes need to be made immediately.  Otherwise, you will receive a letter about your results with an explanation, but please check with MyChart first.  Please remember to sign up for MyChart if you have not done so, as this will be important to you in the future with finding out test results, communicating by private email, and scheduling acute appointments online when needed.  Please make an Appointment to return in 6 months, or sooner if needed

## 2022-02-26 NOTE — Assessment & Plan Note (Signed)
Age and sex appropriate education and counseling updated with regular exercise and diet Referrals for preventative services - for colonoscopy, eye exam Immunizations addressed - for shingles soht #1 today Smoking counseling  - counsled to quit, pt not ready Evidence for depression or other mood disorder - none significant Most recent labs reviewed. I have personally reviewed and have noted: 1) the patient's medical and social history 2) The patient's current medications and supplements 3) The patient's height, weight, and BMI have been recorded in the chart

## 2022-02-26 NOTE — Assessment & Plan Note (Signed)
Lab Results  Component Value Date   HGBA1C 6.7 (A) 08/25/2021   Mild uncontrolled, , pt to start ozempic 0.50 weekly

## 2022-02-26 NOTE — Assessment & Plan Note (Signed)
Last vitamin D Lab Results  Component Value Date   VD25OH 7.54 (L) 12/27/2020   Low, to start oral replacement 

## 2022-02-26 NOTE — Progress Notes (Signed)
Patient ID: Hipolito Martinezlopez, male   DOB: 13-Nov-1955, 66 y.o.   MRN: [id number]         Chief Complaint:: wellness exam and low vit d, hyperglycemia, hld, dm       HPI:  Aqeel Norgaard is a 66 y.o. male here for wellness exam; due for colonoscopy, eye exam, shingrix o/w up to date                        Also did not take the lipitor from last exam.  Trying to follow lower chol diet but willing to start now.  Also overwt with hx of hyperglycemia, htn - willing to start ozempic as well.  Still smoking, not ready to quit, but may really try soon.  Pt denies chest pain, increased sob or doe, wheezing, orthopnea, PND, increased LE swelling, palpitations, dizziness or syncope.   Pt denies polydipsia, polyuria, or new focal neuro s/s.    Pt denies fever, wt loss, night sweats, loss of appetite, or other constitutional symptoms  no other new complaints  Wt Readings from Last 3 Encounters:  02/26/22 259 lb (117.5 kg)  09/04/21 258 lb (117 kg)  08/25/21 257 lb (116.6 kg)   BP Readings from Last 3 Encounters:  02/26/22 136/78  09/04/21 120/76  08/25/21 118/70   Immunization History  Administered Date(s) Administered   Influenza-Unspecified 06/26/2013, 06/14/2014, 06/13/2015, 06/25/2016, 06/17/2017, 07/04/2018, 06/16/2019   PFIZER Comirnaty(Gray Top)Covid-19 Tri-Sucrose Vaccine 10/10/2019, 11/21/2019   PFIZER(Purple Top)SARS-COV-2 Vaccination 10/10/2019, 11/21/2019, 12/03/2020   Pneumococcal Conjugate-13 12/27/2020   Tdap 11/07/2018   Zoster Recombinat (Shingrix) 02/26/2022   Health Maintenance Due  Topic Date Due   OPHTHALMOLOGY EXAM  Never done   COLONOSCOPY (Pts 45-71yrs Insurance coverage will need to be confirmed)  Never done   URINE MICROALBUMIN  12/27/2021   HEMOGLOBIN A1C  02/22/2022      Past Medical History:  Diagnosis Date   Enlarged testicle 12/05/2019   Erectile dysfunction 12/05/2019   HLD (hyperlipidemia) 12/07/2019   Smoker    Type 2 diabetes mellitus without complication,  without long-term current use of insulin (HCC) 12/06/2018   Vitamin D deficiency 12/07/2019   History reviewed. No pertinent surgical history.  reports that he has been smoking. He has never used smokeless tobacco. He reports current alcohol use of about 1.0 standard drink per week. He reports that he does not use drugs. family history includes Heart disease in his mother; Hypertension in his mother. No Known Allergies Current Outpatient Medications on File Prior to Visit  Medication Sig Dispense Refill   Cholecalciferol (THERA-D 2000) 50 MCG (2000 UT) TABS 1 tab by mouth once daily 30 tablet 99   No current facility-administered medications on file prior to visit.        ROS:  All others reviewed and negative.  Objective        PE:  BP 136/78 (BP Location: Left Arm, Patient Position: Sitting, Cuff Size: Large)   Pulse 70   Ht 6\' 8"  (2.032 m)   Wt 259 lb (117.5 kg)   SpO2 92%   BMI 28.45 kg/m                 Constitutional: Pt appears in NAD               HENT: Head: NCAT.                Right Ear: External ear normal.  Left Ear: External ear normal.                Eyes: . Pupils are equal, round, and reactive to light. Conjunctivae and EOM are normal               Nose: without d/c or deformity               Neck: Neck supple. Gross normal ROM               Cardiovascular: Normal rate and regular rhythm.                 Pulmonary/Chest: Effort normal and breath sounds without rales or wheezing.                Abd:  Soft, NT, ND, + BS, no organomegaly               Neurological: Pt is alert. At baseline orientation, motor grossly intact               Skin: Skin is warm. No rashes, no other new lesions, LE edema - nonefor                Psychiatric: Pt behavior is normal without agitation   Micro: none  Cardiac tracings I have personally interpreted today:  none  Pertinent Radiological findings (summarize): none   Lab Results  Component Value Date   WBC 8.4  12/27/2020   HGB 14.7 12/27/2020   HCT 43.9 12/27/2020   PLT 222.0 12/27/2020   GLUCOSE 89 12/27/2020   CHOL 216 (H) 12/27/2020   TRIG 128.0 12/27/2020   HDL 38.70 (L) 12/27/2020   LDLCALC 152 (H) 12/27/2020   ALT 17 12/27/2020   AST 22 12/27/2020   NA 139 12/27/2020   K 3.9 12/27/2020   CL 109 12/27/2020   CREATININE 0.83 12/27/2020   BUN 11 12/27/2020   CO2 22 12/27/2020   TSH 1.36 12/27/2020   PSA 0.44 12/27/2020   HGBA1C 6.7 (A) 08/25/2021   MICROALBUR <0.7 12/27/2020   Assessment/Plan:  Jilda Pandahomas Hunzeker is a 66 y.o. Black or African American [2] male with  has a past medical history of Enlarged testicle (12/05/2019), Erectile dysfunction (12/05/2019), HLD (hyperlipidemia) (12/07/2019), Smoker, Type 2 diabetes mellitus without complication, without long-term current use of insulin (HCC) (12/06/2018), and Vitamin D deficiency (12/07/2019).  Vitamin D deficiency Last vitamin D Lab Results  Component Value Date   VD25OH 7.54 (L) 12/27/2020   Low, to start oral replacement   Encounter for well adult exam with abnormal findings Age and sex appropriate education and counseling updated with regular exercise and diet Referrals for preventative services - for colonoscopy, eye exam Immunizations addressed - for shingles soht #1 today Smoking counseling  - counsled to quit, pt not ready Evidence for depression or other mood disorder - none significant Most recent labs reviewed. I have personally reviewed and have noted: 1) the patient's medical and social history 2) The patient's current medications and supplements 3) The patient's height, weight, and BMI have been recorded in the chart   Type 2 diabetes mellitus without complication, without long-term current use of insulin (HCC) Lab Results  Component Value Date   HGBA1C 6.7 (A) 08/25/2021   Mild uncontrolled, , pt to start ozempic 0.50 weekly   Hypertension BP Readings from Last 3 Encounters:  02/26/22 136/78  09/04/21  120/76  08/25/21 118/70   Stable, pt to continue medical treatment  -  wt control, low salt, excercise   HLD (hyperlipidemia) Lab Results  Component Value Date   LDLCALC 152 (H) 12/27/2020   uncontrolled, pt to start lipitor 20 qd, f/u lab today, goal ldl < 70   Smoker Pt counsled to quit, pt not ready  Erectile dysfunction Stable, cont viagra prn  Screen for colon cancer For colonoscopy  Blurred vision For optho referral  Followup: Return in about 6 months (around 08/28/2022).  Oliver Barre, MD 02/26/2022 1:02 PM [facility]

## 2022-02-26 NOTE — Assessment & Plan Note (Signed)
For colonoscopy 

## 2022-02-26 NOTE — Assessment & Plan Note (Signed)
For optho referral 

## 2022-02-26 NOTE — Assessment & Plan Note (Signed)
Pt counsled to quit, pt not ready °

## 2022-02-26 NOTE — Assessment & Plan Note (Signed)
BP Readings from Last 3 Encounters:  02/26/22 136/78  09/04/21 120/76  08/25/21 118/70   Stable, pt to continue medical treatment  - wt control, low salt, excercise

## 2022-02-27 ENCOUNTER — Other Ambulatory Visit (HOSPITAL_COMMUNITY): Payer: Self-pay

## 2022-03-02 ENCOUNTER — Other Ambulatory Visit (HOSPITAL_COMMUNITY): Payer: Self-pay

## 2022-03-04 ENCOUNTER — Encounter: Payer: Self-pay | Admitting: *Deleted

## 2022-03-05 ENCOUNTER — Telehealth: Payer: Self-pay | Admitting: *Deleted

## 2022-03-05 NOTE — Telephone Encounter (Signed)
Rec'd fax for PA Ozempic  submitted via cover w/ (Key: [facility]). PA sent to [employer] is reviewing your PA request. You may close this dialog, return to your dashboard, and perform other tasks...Shawn Rivera

## 2022-03-09 ENCOUNTER — Other Ambulatory Visit (HOSPITAL_COMMUNITY): Payer: Self-pay

## 2022-03-09 NOTE — Telephone Encounter (Signed)
Checking status on PA. Rec'd msg " This request has been approved effective 03/07/2022 to 03/07/2023.  Faxed approval letter to [employer]...Johny Chess

## 2022-03-10 ENCOUNTER — Other Ambulatory Visit (HOSPITAL_COMMUNITY): Payer: Self-pay

## 2022-03-18 ENCOUNTER — Other Ambulatory Visit (HOSPITAL_COMMUNITY): Payer: Self-pay

## 2022-03-25 ENCOUNTER — Other Ambulatory Visit (HOSPITAL_COMMUNITY): Payer: Self-pay

## 2022-05-28 ENCOUNTER — Other Ambulatory Visit (HOSPITAL_COMMUNITY): Payer: Self-pay

## 2022-06-29 ENCOUNTER — Other Ambulatory Visit (HOSPITAL_COMMUNITY): Payer: Self-pay

## 2022-07-09 ENCOUNTER — Other Ambulatory Visit (HOSPITAL_COMMUNITY): Payer: Self-pay

## 2022-08-19 ENCOUNTER — Ambulatory Visit: Payer: No Typology Code available for payment source | Admitting: Family Medicine

## 2022-08-19 NOTE — Progress Notes (Deleted)
   I, Philbert Riser, LAT, ATC acting as a scribe for Clementeen Graham, MD.  Subjective:    CC: Right ankle pain  HPI: Patient is a 66 year old male presenting with right ankle pain?  Patient locates pain to?  R ankle swelling: Aggravates: Treatments tried:   Pertinent review of Systems: ***  Relevant historical information: ***   Objective:   There were no vitals filed for this visit. General: Well Developed, well nourished, and in no acute distress.   MSK: ***  Lab and Radiology Results No results found for this or any previous visit (from the past 72 hour(s)). No results found.    Impression and Recommendations:    Assessment and Plan: 66 y.o. male with ***.  PDMP not reviewed this encounter. No orders of the defined types were placed in this encounter.  No orders of the defined types were placed in this encounter.   Discussed warning signs or symptoms. Please see discharge instructions. Patient expresses understanding.   ***

## 2022-08-28 ENCOUNTER — Ambulatory Visit: Payer: No Typology Code available for payment source | Admitting: Internal Medicine

## 2022-09-05 ENCOUNTER — Other Ambulatory Visit (HOSPITAL_COMMUNITY): Payer: Self-pay

## 2022-09-12 ENCOUNTER — Other Ambulatory Visit (HOSPITAL_COMMUNITY): Payer: Self-pay

## 2022-10-10 ENCOUNTER — Other Ambulatory Visit (HOSPITAL_COMMUNITY): Payer: Self-pay

## 2022-10-21 ENCOUNTER — Ambulatory Visit: Payer: 59 | Admitting: Internal Medicine

## 2022-10-23 ENCOUNTER — Ambulatory Visit: Payer: 59 | Admitting: Internal Medicine

## 2022-10-29 ENCOUNTER — Ambulatory Visit: Payer: 59 | Admitting: Internal Medicine

## 2022-11-20 ENCOUNTER — Other Ambulatory Visit (HOSPITAL_COMMUNITY): Payer: Self-pay

## 2022-11-25 ENCOUNTER — Other Ambulatory Visit (HOSPITAL_COMMUNITY): Payer: Self-pay

## 2022-11-25 ENCOUNTER — Ambulatory Visit (INDEPENDENT_AMBULATORY_CARE_PROVIDER_SITE_OTHER): Payer: 59 | Admitting: Internal Medicine

## 2022-11-25 ENCOUNTER — Other Ambulatory Visit: Payer: Self-pay | Admitting: Internal Medicine

## 2022-11-25 VITALS — BP 136/84 | HR 60 | Temp 97.9°F | Ht >= 80 in | Wt 244.0 lb

## 2022-11-25 DIAGNOSIS — I10 Essential (primary) hypertension: Secondary | ICD-10-CM | POA: Diagnosis not present

## 2022-11-25 DIAGNOSIS — E559 Vitamin D deficiency, unspecified: Secondary | ICD-10-CM | POA: Diagnosis not present

## 2022-11-25 DIAGNOSIS — E7800 Pure hypercholesterolemia, unspecified: Secondary | ICD-10-CM

## 2022-11-25 DIAGNOSIS — Z125 Encounter for screening for malignant neoplasm of prostate: Secondary | ICD-10-CM | POA: Diagnosis not present

## 2022-11-25 DIAGNOSIS — Z1211 Encounter for screening for malignant neoplasm of colon: Secondary | ICD-10-CM | POA: Diagnosis not present

## 2022-11-25 DIAGNOSIS — Z0001 Encounter for general adult medical examination with abnormal findings: Secondary | ICD-10-CM | POA: Diagnosis not present

## 2022-11-25 DIAGNOSIS — E538 Deficiency of other specified B group vitamins: Secondary | ICD-10-CM

## 2022-11-25 DIAGNOSIS — E119 Type 2 diabetes mellitus without complications: Secondary | ICD-10-CM

## 2022-11-25 DIAGNOSIS — F17200 Nicotine dependence, unspecified, uncomplicated: Secondary | ICD-10-CM | POA: Diagnosis not present

## 2022-11-25 DIAGNOSIS — Z23 Encounter for immunization: Secondary | ICD-10-CM | POA: Diagnosis not present

## 2022-11-25 LAB — HEPATIC FUNCTION PANEL
ALT: 15 U/L (ref 0–53)
AST: 22 U/L (ref 0–37)
Albumin: 3.8 g/dL (ref 3.5–5.2)
Alkaline Phosphatase: 88 U/L (ref 39–117)
Bilirubin, Direct: 0.1 mg/dL (ref 0.0–0.3)
Total Bilirubin: 0.5 mg/dL (ref 0.2–1.2)
Total Protein: 6.3 g/dL (ref 6.0–8.3)

## 2022-11-25 LAB — URINALYSIS, ROUTINE W REFLEX MICROSCOPIC
Bilirubin Urine: NEGATIVE
Hgb urine dipstick: NEGATIVE
Ketones, ur: NEGATIVE
Leukocytes,Ua: NEGATIVE
Nitrite: NEGATIVE
RBC / HPF: NONE SEEN (ref 0–?)
Specific Gravity, Urine: 1.025 (ref 1.000–1.030)
Total Protein, Urine: NEGATIVE
Urine Glucose: NEGATIVE
Urobilinogen, UA: 0.2 (ref 0.0–1.0)
pH: 6 (ref 5.0–8.0)

## 2022-11-25 LAB — CBC WITH DIFFERENTIAL/PLATELET
Basophils Absolute: 0.1 10*3/uL (ref 0.0–0.1)
Basophils Relative: 0.5 % (ref 0.0–3.0)
Eosinophils Absolute: 0.3 10*3/uL (ref 0.0–0.7)
Eosinophils Relative: 3.1 % (ref 0.0–5.0)
HCT: 44.3 % (ref 39.0–52.0)
Hemoglobin: 14.9 g/dL (ref 13.0–17.0)
Lymphocytes Relative: 27.7 % (ref 12.0–46.0)
Lymphs Abs: 2.7 10*3/uL (ref 0.7–4.0)
MCHC: 33.6 g/dL (ref 30.0–36.0)
MCV: 86.9 fl (ref 78.0–100.0)
Monocytes Absolute: 0.6 10*3/uL (ref 0.1–1.0)
Monocytes Relative: 6.6 % (ref 3.0–12.0)
Neutro Abs: 6 10*3/uL (ref 1.4–7.7)
Neutrophils Relative %: 62.1 % (ref 43.0–77.0)
Platelets: 214 10*3/uL (ref 150.0–400.0)
RBC: 5.1 Mil/uL (ref 4.22–5.81)
RDW: 14.8 % (ref 11.5–15.5)
WBC: 9.7 10*3/uL (ref 4.0–10.5)

## 2022-11-25 LAB — PSA: PSA: 1.06 ng/mL (ref 0.10–4.00)

## 2022-11-25 LAB — BASIC METABOLIC PANEL
BUN: 10 mg/dL (ref 6–23)
CO2: 21 mEq/L (ref 19–32)
Calcium: 9.1 mg/dL (ref 8.4–10.5)
Chloride: 110 mEq/L (ref 96–112)
Creatinine, Ser: 0.81 mg/dL (ref 0.40–1.50)
GFR: 92.62 mL/min (ref >60.00)
Glucose, Bld: 107 mg/dL — ABNORMAL HIGH (ref 70–99)
Potassium: 3.9 mEq/L (ref 3.5–5.1)
Sodium: 139 mEq/L (ref 135–145)

## 2022-11-25 LAB — MICROALBUMIN / CREATININE URINE RATIO
Creatinine,U: 169.9 mg/dL
Microalb Creat Ratio: 0.4 mg/g (ref 0.0–30.0)
Microalb, Ur: <0.7 mg/dL (ref 0.0–1.9)

## 2022-11-25 LAB — LIPID PANEL
Cholesterol: 185 mg/dL (ref 0–200)
HDL: 35.5 mg/dL — ABNORMAL LOW (ref >39.00)
LDL Cholesterol: 127 mg/dL — ABNORMAL HIGH (ref 0–99)
NonHDL: 149.03
Total CHOL/HDL Ratio: 5
Triglycerides: 110 mg/dL (ref 0.0–149.0)
VLDL: 22 mg/dL (ref 0.0–40.0)

## 2022-11-25 LAB — HEMOGLOBIN A1C: Hgb A1c MFr Bld: 6.9 % — ABNORMAL HIGH (ref 4.6–6.5)

## 2022-11-25 LAB — VITAMIN B12: Vitamin B-12: 288 pg/mL (ref 211–911)

## 2022-11-25 LAB — VITAMIN D 25 HYDROXY (VIT D DEFICIENCY, FRACTURES): VITD: <7 ng/mL — ABNORMAL LOW (ref 30.00–100.00)

## 2022-11-25 LAB — TSH: TSH: 2.37 u[IU]/mL (ref 0.35–5.50)

## 2022-11-25 MED ORDER — ATORVASTATIN CALCIUM 20 MG PO TABS
20.0000 mg | ORAL_TABLET | Freq: Every day | ORAL | 3 refills | Status: DC
  Filled 2022-11-25: qty 90, 90d supply, fill #0

## 2022-11-25 MED ORDER — SILDENAFIL CITRATE 100 MG PO TABS
50.0000 mg | ORAL_TABLET | Freq: Every day | ORAL | 11 refills | Status: DC | PRN
  Filled 2022-11-25 – 2023-01-25 (×3): qty 10, 10d supply, fill #0
  Filled 2023-04-15: qty 10, 10d supply, fill #1
  Filled 2023-06-28: qty 10, 10d supply, fill #2
  Filled 2023-07-22: qty 10, 10d supply, fill #3
  Filled 2023-09-30: qty 10, 10d supply, fill #4

## 2022-11-25 MED ORDER — VARENICLINE TARTRATE 1 MG PO TABS
1.0000 mg | ORAL_TABLET | Freq: Two times a day (BID) | ORAL | 1 refills | Status: DC
  Filled 2022-11-25: qty 60, 30d supply, fill #0

## 2022-11-25 MED ORDER — VARENICLINE TARTRATE (STARTER) 0.5 MG X 11 & 1 MG X 42 PO TBPK
ORAL_TABLET | ORAL | 0 refills | Status: DC
  Filled 2022-11-25: qty 53, 28d supply, fill #0

## 2022-11-25 MED ORDER — ATORVASTATIN CALCIUM 40 MG PO TABS: 40.0000 mg | ORAL_TABLET | Freq: Every day | ORAL | 3 refills | Status: DC

## 2022-11-25 MED ORDER — OZEMPIC (0.25 OR 0.5 MG/DOSE) 2 MG/3ML ~~LOC~~ SOPN
0.5000 mg | PEN_INJECTOR | SUBCUTANEOUS | 3 refills | Status: DC
  Filled 2022-11-25: qty 3, 28d supply, fill #0

## 2022-11-25 NOTE — Progress Notes (Signed)
Patient ID: Shawn Rivera, male   DOB: Sep 12, 1956, 67 y.o.   MRN: [id number]         Chief Complaint:: wellness exam and htn, hld, smoker, dm, low vit d       HPI:  Shawn Rivera is a 67 y.o. 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  Pt denies chest pain, increased sob or doe, wheezing, orthopnea, PND, increased LE swelling, palpitations, dizziness or syncope.   Pt denies polydipsia, polyuria, or new focal neuro s/s.    Pt denies fever, wt loss, night sweats, loss of appetite, or other constitutional symptoms  Still smoking, now ready to try quit with chantix.  Due for pulm referral for LDCT  Wt Readings from Last 3 Encounters:  11/25/22 244 lb (110.7 kg)  02/26/22 259 lb (117.5 kg)  09/04/21 258 lb (117 kg)   BP Readings from Last 3 Encounters:  11/25/22 136/84  02/26/22 136/78  09/04/21 120/76   Immunization History  Administered Date(s) Administered   Influenza-Unspecified 06/26/2013, 06/14/2014, 06/13/2015, 06/25/2016, 06/17/2017, 07/04/2018, 06/16/2019   PFIZER Comirnaty(Gray Top)Covid-19 Tri-Sucrose Vaccine 10/10/2019, 11/21/2019   PFIZER(Purple Top)SARS-COV-2 Vaccination 10/10/2019, 11/21/2019, 12/03/2020   Pneumococcal Conjugate-13 12/27/2020   Pneumococcal Polysaccharide-23 11/25/2022   Tdap 11/07/2018   Zoster Recombinat (Shingrix) 02/26/2022, 11/25/2022   Health Maintenance Due  Topic Date Due   OPHTHALMOLOGY EXAM  Never done      Past Medical History:  Diagnosis Date   Enlarged testicle 12/05/2019   Erectile dysfunction 12/05/2019   HLD (hyperlipidemia) 12/07/2019   Smoker    Type 2 diabetes mellitus without complication, without long-term current use of insulin ([place]) 12/06/2018   Vitamin D deficiency 12/07/2019   History reviewed. No pertinent surgical history.   reports that he has been smoking. He has never used smokeless tobacco. He reports current alcohol use of about 1.0 standard drink of alcohol per week. He reports that he does not use drugs. family history includes Heart disease in his mother; Hypertension in his mother. No Known Allergies Current Outpatient Medications on File Prior to Visit  Medication Sig Dispense Refill   Cholecalciferol (THERA-D 2000) 50 MCG (2000 UT) TABS 1 tab by mouth once daily 30 tablet 99   No current facility-administered medications on file prior to visit.        ROS:  All others reviewed and negative.  Objective        PE:  BP 136/84 (BP Location: Right Arm, Patient Position: Sitting, Cuff Size: Large)   Pulse 60   Temp 97.9 F (36.6 C) (Oral)   Ht '6\' 8"'$  (2.032 m)   Wt 244 lb (110.7 kg)   SpO2 93%   BMI 26.80 kg/m                 Constitutional: Pt appears in NAD               HENT: Head: NCAT.                Right Ear: External ear normal.                 Left Ear: External ear normal.  Eyes: . Pupils are equal, round, and reactive to light. Conjunctivae and EOM are normal               Nose: without d/c or deformity               Neck: Neck supple. Gross normal ROM               Cardiovascular: Normal rate and regular rhythm.                 Pulmonary/Chest: Effort normal and breath sounds without rales or wheezing.                Abd:  Soft, NT, ND, + BS, no organomegaly               Neurological: Pt is alert. At baseline orientation, motor grossly intact               Skin: Skin is warm. No rashes, no other new lesions, LE edema - none               Psychiatric: Pt behavior is normal without agitation   Micro: none  Cardiac tracings I have personally interpreted today:  none  Pertinent Radiological findings (summarize): none   Lab Results  Component Value Date   WBC 9.7 11/25/2022   HGB 14.9 11/25/2022   HCT 44.3 11/25/2022   PLT 214.0 11/25/2022   GLUCOSE 107 (H)  11/25/2022   CHOL 185 11/25/2022   TRIG 110.0 11/25/2022   HDL 35.50 (L) 11/25/2022   LDLCALC 127 (H) 11/25/2022   ALT 15 11/25/2022   AST 22 11/25/2022   NA 139 11/25/2022   K 3.9 11/25/2022   CL 110 11/25/2022   CREATININE 0.81 11/25/2022   BUN 10 11/25/2022   CO2 21 11/25/2022   TSH 2.37 11/25/2022   PSA 1.06 11/25/2022   HGBA1C 6.9 (H) 11/25/2022   MICROALBUR <0.7 11/25/2022   Assessment/Plan:  Shawn Rivera is a 67 y.o. Black or African American [2] male with  has a past medical history of Enlarged testicle (12/05/2019), Erectile dysfunction (12/05/2019), HLD (hyperlipidemia) (12/07/2019), Smoker, Type 2 diabetes mellitus without complication, without long-term current use of insulin ([place]) (12/06/2018), and Vitamin D deficiency (12/07/2019).  Encounter for well adult exam with abnormal findings Age and sex appropriate education and counseling updated with regular exercise and diet Referrals for preventative services - for eye exam referral, also for colonoscopy Immunizations addressed - declines covid booster, flu shot but for shingles #2 and pneumovax today Smoking counseling  - pt counsled to quit, pt not ready, also for pulm referral for LDCT screening Evidence for depression or other mood disorder - none significant Most recent labs reviewed. I have personally reviewed and have noted: 1) the patient's medical and social history 2) The patient's current medications and supplements 3) The patient's height, weight, and BMI have been recorded in the chart   HLD (hyperlipidemia) Lab Results  Component Value Date   LDLCALC 127 (H) 11/25/2022   Uncontrolled,  goal ldl < 100,, pt for increased lipitor 40 mg qd   Hypertension BP Readings from Last 3 Encounters:  11/25/22 136/84  02/26/22 136/78  09/04/21 120/76   Stable, pt to continue medical treatment  - diet, wt control, low salt   Smoker Pt counsled to quit, pt for chantix x 3 mo,, also for referral to pulm for LDCT  screening  Type 2 diabetes mellitus without complication, without long-term current use  of insulin ([place]) Lab Results  Component Value Date   HGBA1C 6.9 (H) 11/25/2022   Stable, pt to continue current medical treatment ozempic 0.5 mg weekly   Vitamin D deficiency Last vitamin D Lab Results  Component Value Date   VD25OH <7.00 (L) 11/25/2022   Low, to start oral replacement   Screen for colon cancer For colonoscopy referral  Followup: Return in about 6 months (around 05/26/2023).  Cathlean Cower, MD 11/28/2022 7:48 PM [facility]

## 2022-11-25 NOTE — Patient Instructions (Signed)
You had the Shingles shot #2, and the Pneumovax today  Please take all new medication as prescribed - the Chantix to help quit smoking  Please continue all other medications as before, and refills have been done if requested.  Please have the pharmacy call with any other refills you may need.  Please continue your efforts at being more active, low cholesterol diet, and weight control.  You are otherwise up to date with prevention measures today.  Please keep your appointments with your specialists as you may have planned  You will be contacted regarding the referral for: colonoscopy, and pulmonary for the yearly CT of the lungs  Please go to the LAB at the blood drawing area for the tests to be done  You will be contacted by phone if any changes need to be made immediately.  Otherwise, you will receive a letter about your results with an explanation, but please check with MyChart first.  Please remember to sign up for MyChart if you have not done so, as this will be important to you in the future with finding out test results, communicating by private email, and scheduling acute appointments online when needed.  Please make an Appointment to return in 6 months, or sooner if needed

## 2022-11-28 ENCOUNTER — Encounter: Payer: Self-pay | Admitting: Internal Medicine

## 2022-11-28 NOTE — Assessment & Plan Note (Signed)
For colonoscopy referral

## 2022-11-28 NOTE — Assessment & Plan Note (Signed)
Lab Results  Component Value Date   HGBA1C 6.9 (H) 11/25/2022   Stable, pt to continue current medical treatment ozempic 0.5 mg weekly

## 2022-11-28 NOTE — Assessment & Plan Note (Signed)
Lab Results  Component Value Date   LDLCALC 127 (H) 11/25/2022   Uncontrolled,  goal ldl < 100,, pt for increased lipitor 40 mg qd

## 2022-11-28 NOTE — Assessment & Plan Note (Signed)
BP Readings from Last 3 Encounters:  11/25/22 136/84  02/26/22 136/78  09/04/21 120/76   Stable, pt to continue medical treatment  - diet, wt control, low salt

## 2022-11-28 NOTE — Assessment & Plan Note (Addendum)
Pt counsled to quit, pt for chantix x 3 mo,, also for referral to pulm for LDCT screening

## 2022-11-28 NOTE — Assessment & Plan Note (Signed)
Last vitamin D Lab Results  Component Value Date   VD25OH <7.00 (L) 11/25/2022   Low, to start oral replacement

## 2022-11-28 NOTE — Assessment & Plan Note (Addendum)
Age and sex appropriate education and counseling updated with regular exercise and diet Referrals for preventative services - for eye exam referral, also for colonoscopy Immunizations addressed - declines covid booster, flu shot but for shingles #2 and pneumovax today Smoking counseling  - pt counsled to quit, pt not ready, also for pulm referral for LDCT screening Evidence for depression or other mood disorder - none significant Most recent labs reviewed. I have personally reviewed and have noted: 1) the patient's medical and social history 2) The patient's current medications and supplements 3) The patient's height, weight, and BMI have been recorded in the chart

## 2022-11-30 ENCOUNTER — Other Ambulatory Visit (HOSPITAL_COMMUNITY): Payer: Self-pay

## 2022-12-01 ENCOUNTER — Other Ambulatory Visit (HOSPITAL_COMMUNITY): Payer: Self-pay

## 2022-12-04 ENCOUNTER — Other Ambulatory Visit (HOSPITAL_COMMUNITY): Payer: Self-pay

## 2022-12-29 ENCOUNTER — Other Ambulatory Visit (HOSPITAL_COMMUNITY): Payer: Self-pay

## 2023-01-25 ENCOUNTER — Other Ambulatory Visit (HOSPITAL_COMMUNITY): Payer: Self-pay

## 2023-03-06 ENCOUNTER — Other Ambulatory Visit (HOSPITAL_COMMUNITY): Payer: Self-pay

## 2023-03-31 ENCOUNTER — Encounter: Payer: Self-pay | Admitting: Orthopedic Surgery

## 2023-03-31 ENCOUNTER — Ambulatory Visit (INDEPENDENT_AMBULATORY_CARE_PROVIDER_SITE_OTHER): Payer: 59 | Admitting: Orthopedic Surgery

## 2023-03-31 ENCOUNTER — Other Ambulatory Visit: Payer: Self-pay

## 2023-03-31 ENCOUNTER — Other Ambulatory Visit (INDEPENDENT_AMBULATORY_CARE_PROVIDER_SITE_OTHER): Payer: 59

## 2023-03-31 DIAGNOSIS — M25571 Pain in right ankle and joints of right foot: Secondary | ICD-10-CM | POA: Diagnosis not present

## 2023-03-31 DIAGNOSIS — M79672 Pain in left foot: Secondary | ICD-10-CM

## 2023-03-31 NOTE — Progress Notes (Signed)
Office Visit Note   Patient: Shawn Rivera           Date of Birth: [birth year]           MRN: [id number] Visit Date: 03/31/2023 Requested by: Corwin Levins, MD [address] [place],  [place] [address] PCP: Corwin Levins, MD  Subjective: Chief Complaint  Patient presents with   Right Ankle - Pain   Left Foot - Pain    HPI: Shawn Rivera is a 67 y.o. male who presents to the office reporting right ankle pain and left foot pain.  The right ankle pain is longstanding and he describes giving out as well as pain in the ankle joint.  Left heel just started about 3 weeks ago.  Reports pain in the morning and it is on the plantar aspect of the calcaneal region.  Hard for him to walk.  He works in Public affairs consultant at [employer]  Lateral pain is prominent in the right ankle.  Patient has shoes that he wears to work which have fairly flat arches.  No medications used for pain.  Patient is not diabetic..                ROS: All systems reviewed are negative as they relate to the chief complaint within the history of present illness.  Patient denies fevers or chills.  Assessment & Plan: Visit Diagnoses:  1. Pain in right ankle and joints of right foot   2. Pain in left foot     Plan: Impression is right ankle OCD lesion talar dome on the lateral side.  MRI scan pending for further evaluation of that.  I think some of his giving way symptoms could be related to loose chondral flaps within the ankle.  The ankle itself feels stable and he does not really report any history of recent injury to that right ankle.  Left heel looks more like plantar fasciitis.  I think better shoes with better arches would help that.  He will follow-up in 3 weeks after his MRI scan.  Follow-Up Instructions: No follow-ups on file.   Orders:  Orders Placed This Encounter  Procedures   XR Ankle Complete Right   XR Foot Complete Left   No orders of the defined types were placed in this encounter.      Procedures: No procedures performed   Clinical Data: No additional findings.  Objective: Vital Signs: There were no vitals taken for this visit.  Physical Exam:  Constitutional: Patient appears well-developed HEENT:  Head: Normocephalic Eyes:EOM are normal Neck: Normal range of motion Cardiovascular: Normal rate Pulmonary/chest: Effort normal Neurologic: Patient is alert Skin: Skin is warm Psychiatric: Patient has normal mood and affect  Ortho Exam: Ortho exam demonstrates palpable intact nontender anterior to posterior to peroneal and Achilles tendons bilaterally.  Has fairly reasonable dorsiflexion about 15 degrees past neutral in both feet.  Has symmetric tibiotalar subtalar transverse tarsal range of motion.  On the right side there is some lateral pain in the sinus Tarsi region.  On the left side there is tenderness to palpation on the plantar aspect of the calcaneus with negative squeeze test.  Specialty Comments:  No specialty comments available.  Imaging: No results found.   PMFS History: Patient Active Problem List   Diagnosis Date Noted   Blurred vision 02/26/2022   Screen for colon cancer 02/26/2022   Pain in right testicle 09/04/2021   Vitamin D deficiency 12/07/2019   HLD (hyperlipidemia) 12/07/2019  Encounter for well adult exam with abnormal findings 12/05/2019   Low libido 12/05/2019   Enlarged testicle 12/05/2019   Erectile dysfunction 12/05/2019   Smoker    Type 2 diabetes mellitus without complication, without long-term current use of insulin (HCC) 12/06/2018   Hypertension 11/07/2018   Past Medical History:  Diagnosis Date   Enlarged testicle 12/05/2019   Erectile dysfunction 12/05/2019   HLD (hyperlipidemia) 12/07/2019   Smoker    Type 2 diabetes mellitus without complication, without long-term current use of insulin (HCC) 12/06/2018   Vitamin D deficiency 12/07/2019    Family History  Problem Relation Age of Onset   Heart disease Mother     Hypertension Mother     No past surgical history on file. Social History   Occupational History   Not on file  Tobacco Use   Smoking status: Every Day   Smokeless tobacco: Never  Substance and Sexual Activity   Alcohol use: Yes    Alcohol/week: 1.0 standard drink of alcohol    Types: 1 Cans of beer per week   Drug use: Never   Sexual activity: Not on file

## 2023-04-10 ENCOUNTER — Ambulatory Visit
Admission: RE | Admit: 2023-04-10 | Discharge: 2023-04-10 | Disposition: A | Discharge status: Home / Self Care | Payer: 59 | Source: Ambulatory Visit | Attending: Orthopedic Surgery | Admitting: Orthopedic Surgery

## 2023-04-10 DIAGNOSIS — S93491A Sprain of other ligament of right ankle, initial encounter: Secondary | ICD-10-CM | POA: Diagnosis not present

## 2023-04-10 DIAGNOSIS — M25571 Pain in right ankle and joints of right foot: Secondary | ICD-10-CM

## 2023-04-15 ENCOUNTER — Other Ambulatory Visit (HOSPITAL_COMMUNITY): Payer: Self-pay

## 2023-04-23 ENCOUNTER — Encounter: Payer: Self-pay | Admitting: Orthopedic Surgery

## 2023-04-23 ENCOUNTER — Ambulatory Visit (INDEPENDENT_AMBULATORY_CARE_PROVIDER_SITE_OTHER): Payer: 59 | Admitting: Orthopedic Surgery

## 2023-04-23 DIAGNOSIS — M25572 Pain in left ankle and joints of left foot: Secondary | ICD-10-CM

## 2023-04-23 NOTE — Progress Notes (Signed)
Office Visit Note   Patient: Shawn Rivera           Date of Birth: Nov 29, 1955           MRN: [id number] Visit Date: 04/23/2023 Requested by: Corwin Levins, MD [address] [place],  [place] [address] PCP: Corwin Levins, MD  Subjective: Chief Complaint  Patient presents with   Other     Review MRI     HPI: Shawn Rivera is a 67 y.o. male who presents to the office reporting right ankle pain and left heel pain.  Since he was last seen has had a right ankle MRI which was obtained because of radiographic abnormality in the talar dome.  Indeed he does have talar dome osteochondral defect.  He states he can live with the right ankle the way it is but his left heel has been very symptomatic over the past 3 weeks.  He does do a lot of stand-up work.  Reports pain more or less with every step he takes.  States he does not get outside much and has never had his vitamin D level checked recently.  He has not obtained new shoes yet per our recommendation after last clinic visit..                ROS: All systems reviewed are negative as they relate to the chief complaint within the history of present illness.  Patient denies fevers or chills.  Assessment & Plan: Visit Diagnoses:  1. Pain in left ankle and joints of left foot     Plan: Impression is right ankle talar dome lesion which the patient wants to live with.  Hard to say if arthroscopy and debridement would necessarily be effective for this problem at this stage.  He is more concerned with his left heel.  Exam findings are consistent with both calcaneal stress fracture as well as plantar fasciitis.  Needs MRI scan of that left ankle to evaluate calcaneal stress fracture versus plantar fasciitis.  That would really change the way this is manage.  Would hold off on plantar fascial injection until we are sure this is not a stress reaction or stress fracture.  Advised him to begin taking some oral vitamin D as well.  Follow-Up  Instructions: No follow-ups on file.   Orders:  Orders Placed This Encounter  Procedures   MR Ankle Left w/o contrast   No orders of the defined types were placed in this encounter.     Procedures: No procedures performed   Clinical Data: No additional findings.  Objective: Vital Signs: There were no vitals taken for this visit.  Physical Exam:  Constitutional: Patient appears well-developed HEENT:  Head: Normocephalic Eyes:EOM are normal Neck: Normal range of motion Cardiovascular: Normal rate Pulmonary/chest: Effort normal Neurologic: Patient is alert Skin: Skin is warm Psychiatric: Patient has normal mood and affect  Ortho Exam: Ortho exam demonstrates mild swelling in the right ankle with intact ankle dorsiflexion plantarflexion inversion eversion strength.  Left ankle has mild tenderness at the plantar fascia attachment site positive calcaneal squeeze test.  High arches.  No real insertional tenderness of the Achilles tendon.  No swelling around the ankle or calcaneus.  Specialty Comments:  No specialty comments available.  Imaging: No results found.   PMFS History: Patient Active Problem List   Diagnosis Date Noted   Blurred vision 02/26/2022   Screen for colon cancer 02/26/2022   Pain in right testicle 09/04/2021   Vitamin D deficiency  12/07/2019   HLD (hyperlipidemia) 12/07/2019   Encounter for well adult exam with abnormal findings 12/05/2019   Low libido 12/05/2019   Enlarged testicle 12/05/2019   Erectile dysfunction 12/05/2019   Smoker    Type 2 diabetes mellitus without complication, without long-term current use of insulin (HCC) 12/06/2018   Hypertension 11/07/2018   Past Medical History:  Diagnosis Date   Enlarged testicle 12/05/2019   Erectile dysfunction 12/05/2019   HLD (hyperlipidemia) 12/07/2019   Smoker    Type 2 diabetes mellitus without complication, without long-term current use of insulin (HCC) 12/06/2018   Vitamin D deficiency  12/07/2019    Family History  Problem Relation Age of Onset   Heart disease Mother    Hypertension Mother     No past surgical history on file. Social History   Occupational History   Not on file  Tobacco Use   Smoking status: Every Day   Smokeless tobacco: Never  Substance and Sexual Activity   Alcohol use: Yes    Alcohol/week: 1.0 standard drink of alcohol    Types: 1 Cans of beer per week   Drug use: Never   Sexual activity: Not on file

## 2023-05-11 ENCOUNTER — Encounter: Payer: Self-pay | Admitting: Orthopedic Surgery

## 2023-05-15 ENCOUNTER — Ambulatory Visit
Admission: RE | Admit: 2023-05-15 | Discharge: 2023-05-15 | Disposition: A | Discharge status: Home / Self Care | Payer: 59 | Source: Ambulatory Visit | Attending: Orthopedic Surgery | Admitting: Orthopedic Surgery

## 2023-05-15 DIAGNOSIS — M722 Plantar fascial fibromatosis: Secondary | ICD-10-CM | POA: Diagnosis not present

## 2023-05-15 DIAGNOSIS — M79672 Pain in left foot: Secondary | ICD-10-CM | POA: Diagnosis not present

## 2023-05-15 DIAGNOSIS — M25572 Pain in left ankle and joints of left foot: Secondary | ICD-10-CM

## 2023-06-09 ENCOUNTER — Telehealth: Payer: Self-pay

## 2023-06-09 NOTE — Telephone Encounter (Signed)
-----   Message from Burnard Bunting sent at 06/09/2023  5:36 PM EDT ----- Does he have follow-up?

## 2023-06-09 NOTE — Progress Notes (Signed)
Does he have follow-up?

## 2023-06-09 NOTE — Telephone Encounter (Signed)
Please call patient and schedule appointment with Dr August Saucer to review MRI

## 2023-06-23 ENCOUNTER — Ambulatory Visit: Payer: 59 | Admitting: Orthopedic Surgery

## 2023-06-23 ENCOUNTER — Encounter: Payer: Self-pay | Admitting: Orthopedic Surgery

## 2023-06-23 DIAGNOSIS — M25571 Pain in right ankle and joints of right foot: Secondary | ICD-10-CM

## 2023-06-23 DIAGNOSIS — M25572 Pain in left ankle and joints of left foot: Secondary | ICD-10-CM | POA: Diagnosis not present

## 2023-06-23 NOTE — Progress Notes (Signed)
Office Visit Note   Patient: Shawn Rivera           Date of Birth: Mar 04, 1956           MRN: [id number] Visit Date: 06/23/2023 Requested by: Corwin Levins, MD [address] [place],  [place] [address] PCP: Corwin Levins, MD  Subjective: Chief Complaint  Patient presents with   Other    Review MRI scan    HPI: Yvan Juhnke is a 67 y.o. male who presents to the office reporting bilateral ankle pain.  On the right-hand side he has known chondral defect on that lateral aspect of the talus.  ATFL also injured.  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  He also has had left ankle MRI scan which shows significant plantar fasciitis with some reactive edema in the calcaneus.  Both these imaging studies are reviewed with the patient today.  He has gotten some inserts for his shoes which are better than nothing but still did not give great arch support..                ROS: All systems reviewed are negative as they relate to the chief complaint within the history of present illness.  Patient denies fevers or chills.  Assessment & Plan: Visit Diagnoses:  1. Pain in left ankle and joints of left foot   2. Pain in right ankle and joints of right foot     Plan: Impression is right ankle chondral defect and ATFL chronic tearing.  He does use a brace for that right ankle which I think is reasonable.  This is something he can live with.  More symptomatic on Storm's feet are his left heel which does show severe plantar fasciitis with some tearing of that medial cord.  I think for now he wants to try to do some stretching as well as actually get some new shoes that have a cushioned heel and a higher arch.  Could consider injection in 2 to 3 months if this intervention does not work.  Follow-Up Instructions: No follow-ups on file.   Orders:  No orders of the defined types were placed in this encounter.  No orders of the defined types were placed in this  encounter.     Procedures: No procedures performed   Clinical Data: No additional findings.  Objective: Vital Signs: There were no vitals taken for this visit.  Physical Exam:  Constitutional: Patient appears well-developed HEENT:  Head: Normocephalic Eyes:EOM are normal Neck: Normal range of motion Cardiovascular: Normal rate Pulmonary/chest: Effort normal Neurologic: Patient is alert Skin: Skin is warm Psychiatric: Patient has normal mood and affect  Ortho Exam: Ortho examination relatively unchanged from prior visit.  Does have lateral sided tenderness on that right ankle with good range of motion palpable intact nontender anterior to posterior. Achilles tendons.  On the left-hand side does have some tenderness around the calcaneus along with plantar tenderness around the plantar fascia attachment.  Heel cords not excessively tight on either ankle.  Specialty Comments:  No specialty comments available.  Imaging: No results found.   PMFS History: Patient Active Problem List   Diagnosis Date Noted   Blurred vision 02/26/2022   Screen for colon cancer 02/26/2022   Pain in right testicle 09/04/2021   Vitamin D deficiency 12/07/2019   HLD (hyperlipidemia) 12/07/2019   Encounter for well adult exam with abnormal findings 12/05/2019   Low libido 12/05/2019   Enlarged testicle 12/05/2019  Erectile dysfunction 12/05/2019   Smoker    Type 2 diabetes mellitus without complication, without long-term current use of insulin (HCC) 12/06/2018   Hypertension 11/07/2018   Past Medical History:  Diagnosis Date   Enlarged testicle 12/05/2019   Erectile dysfunction 12/05/2019   HLD (hyperlipidemia) 12/07/2019   Smoker    Type 2 diabetes mellitus without complication, without long-term current use of insulin (HCC) 12/06/2018   Vitamin D deficiency 12/07/2019    Family History  Problem Relation Age of Onset   Heart disease Mother    Hypertension Mother     No past surgical  history on file. Social History   Occupational History   Not on file  Tobacco Use   Smoking status: Every Day   Smokeless tobacco: Never  Substance and Sexual Activity   Alcohol use: Yes    Alcohol/week: 1.0 standard drink of alcohol    Types: 1 Cans of beer per week   Drug use: Never   Sexual activity: Not on file

## 2023-06-28 ENCOUNTER — Other Ambulatory Visit (HOSPITAL_COMMUNITY): Payer: Self-pay

## 2023-07-06 ENCOUNTER — Other Ambulatory Visit (HOSPITAL_COMMUNITY): Payer: Self-pay

## 2023-07-22 ENCOUNTER — Other Ambulatory Visit (HOSPITAL_COMMUNITY): Payer: Self-pay

## 2023-09-15 ENCOUNTER — Ambulatory Visit: Payer: 59 | Admitting: Orthopedic Surgery

## 2023-09-30 ENCOUNTER — Other Ambulatory Visit (HOSPITAL_COMMUNITY): Payer: Self-pay

## 2023-12-31 ENCOUNTER — Ambulatory Visit: Admitting: Internal Medicine

## 2024-02-11 ENCOUNTER — Other Ambulatory Visit (HOSPITAL_COMMUNITY): Payer: Self-pay

## 2024-02-11 ENCOUNTER — Other Ambulatory Visit: Payer: Self-pay | Admitting: Internal Medicine

## 2024-02-12 ENCOUNTER — Other Ambulatory Visit (HOSPITAL_COMMUNITY): Payer: Self-pay

## 2024-02-12 MED ORDER — SILDENAFIL CITRATE 100 MG PO TABS
50.0000 mg | ORAL_TABLET | Freq: Every day | ORAL | 11 refills | Status: DC | PRN
  Filled 2024-02-12: qty 10, 10d supply, fill #0

## 2024-02-22 ENCOUNTER — Other Ambulatory Visit (HOSPITAL_COMMUNITY): Payer: Self-pay

## 2024-03-16 ENCOUNTER — Ambulatory Visit: Admitting: Internal Medicine

## 2024-03-16 ENCOUNTER — Telehealth: Payer: Self-pay | Admitting: Internal Medicine

## 2024-03-16 ENCOUNTER — Telehealth: Payer: Self-pay | Admitting: Pharmacist

## 2024-03-16 ENCOUNTER — Encounter: Payer: Self-pay | Admitting: Internal Medicine

## 2024-03-16 ENCOUNTER — Other Ambulatory Visit: Payer: Self-pay | Admitting: Internal Medicine

## 2024-03-16 ENCOUNTER — Other Ambulatory Visit (HOSPITAL_COMMUNITY): Payer: Self-pay

## 2024-03-16 VITALS — BP 122/80 | HR 60 | Temp 98.8°F | Ht >= 80 in | Wt 244.0 lb

## 2024-03-16 DIAGNOSIS — E538 Deficiency of other specified B group vitamins: Secondary | ICD-10-CM | POA: Diagnosis not present

## 2024-03-16 DIAGNOSIS — E7800 Pure hypercholesterolemia, unspecified: Secondary | ICD-10-CM

## 2024-03-16 DIAGNOSIS — I10 Essential (primary) hypertension: Secondary | ICD-10-CM

## 2024-03-16 DIAGNOSIS — E119 Type 2 diabetes mellitus without complications: Secondary | ICD-10-CM

## 2024-03-16 DIAGNOSIS — Z7985 Long-term (current) use of injectable non-insulin antidiabetic drugs: Secondary | ICD-10-CM

## 2024-03-16 DIAGNOSIS — Z0001 Encounter for general adult medical examination with abnormal findings: Secondary | ICD-10-CM

## 2024-03-16 DIAGNOSIS — Z1211 Encounter for screening for malignant neoplasm of colon: Secondary | ICD-10-CM

## 2024-03-16 DIAGNOSIS — F17200 Nicotine dependence, unspecified, uncomplicated: Secondary | ICD-10-CM

## 2024-03-16 DIAGNOSIS — N5089 Other specified disorders of the male genital organs: Secondary | ICD-10-CM

## 2024-03-16 DIAGNOSIS — Z125 Encounter for screening for malignant neoplasm of prostate: Secondary | ICD-10-CM

## 2024-03-16 DIAGNOSIS — Z0000 Encounter for general adult medical examination without abnormal findings: Secondary | ICD-10-CM | POA: Diagnosis not present

## 2024-03-16 DIAGNOSIS — N529 Male erectile dysfunction, unspecified: Secondary | ICD-10-CM

## 2024-03-16 DIAGNOSIS — E559 Vitamin D deficiency, unspecified: Secondary | ICD-10-CM | POA: Diagnosis not present

## 2024-03-16 LAB — CBC WITH DIFFERENTIAL/PLATELET
Basophils Absolute: 0.1 10*3/uL (ref 0.0–0.1)
Basophils Relative: 0.5 % (ref 0.0–3.0)
Eosinophils Absolute: 0.3 10*3/uL (ref 0.0–0.7)
Eosinophils Relative: 3.2 % (ref 0.0–5.0)
HCT: 44.4 % (ref 39.0–52.0)
Hemoglobin: 14.8 g/dL (ref 13.0–17.0)
Lymphocytes Relative: 31.9 % (ref 12.0–46.0)
Lymphs Abs: 3.1 10*3/uL (ref 0.7–4.0)
MCHC: 33.3 g/dL (ref 30.0–36.0)
MCV: 86.4 fl (ref 78.0–100.0)
Monocytes Absolute: 0.7 10*3/uL (ref 0.1–1.0)
Monocytes Relative: 7.5 % (ref 3.0–12.0)
Neutro Abs: 5.5 10*3/uL (ref 1.4–7.7)
Neutrophils Relative %: 56.9 % (ref 43.0–77.0)
Platelets: 227 10*3/uL (ref 150.0–400.0)
RBC: 5.14 Mil/uL (ref 4.22–5.81)
RDW: 14.8 % (ref 11.5–15.5)
WBC: 9.7 10*3/uL (ref 4.0–10.5)

## 2024-03-16 LAB — URINALYSIS, ROUTINE W REFLEX MICROSCOPIC
Bilirubin Urine: NEGATIVE
Hgb urine dipstick: NEGATIVE
Ketones, ur: NEGATIVE
Leukocytes,Ua: NEGATIVE
Nitrite: NEGATIVE
RBC / HPF: NONE SEEN (ref 0–?)
Specific Gravity, Urine: 1.025 (ref 1.000–1.030)
Total Protein, Urine: NEGATIVE
Urine Glucose: NEGATIVE
Urobilinogen, UA: 1 (ref 0.0–1.0)
pH: 6 (ref 5.0–8.0)

## 2024-03-16 LAB — BASIC METABOLIC PANEL WITH GFR
BUN: 9 mg/dL (ref 6–23)
CO2: 25 meq/L (ref 19–32)
Calcium: 9.1 mg/dL (ref 8.4–10.5)
Chloride: 110 meq/L (ref 96–112)
Creatinine, Ser: 0.88 mg/dL (ref 0.40–1.50)
GFR: 89.51 mL/min (ref >60.00)
Glucose, Bld: 117 mg/dL — ABNORMAL HIGH (ref 70–99)
Potassium: 4.1 meq/L (ref 3.5–5.1)
Sodium: 141 meq/L (ref 135–145)

## 2024-03-16 LAB — HEPATIC FUNCTION PANEL
ALT: 14 U/L (ref 0–53)
AST: 20 U/L (ref 0–37)
Albumin: 4.1 g/dL (ref 3.5–5.2)
Alkaline Phosphatase: 88 U/L (ref 39–117)
Bilirubin, Direct: 0.1 mg/dL (ref 0.0–0.3)
Total Bilirubin: 0.6 mg/dL (ref 0.2–1.2)
Total Protein: 6.6 g/dL (ref 6.0–8.3)

## 2024-03-16 LAB — TSH: TSH: 1.72 u[IU]/mL (ref 0.35–5.50)

## 2024-03-16 LAB — MICROALBUMIN / CREATININE URINE RATIO
Creatinine,U: 218.8 mg/dL
Microalb Creat Ratio: 3.4 mg/g (ref 0.0–30.0)
Microalb, Ur: 0.8 mg/dL (ref 0.0–1.9)

## 2024-03-16 LAB — LIPID PANEL
Cholesterol: 193 mg/dL (ref 0–200)
HDL: 34.5 mg/dL — ABNORMAL LOW (ref >39.00)
LDL Cholesterol: 138 mg/dL — ABNORMAL HIGH (ref 0–99)
NonHDL: 158.64
Total CHOL/HDL Ratio: 6
Triglycerides: 103 mg/dL (ref 0.0–149.0)
VLDL: 20.6 mg/dL (ref 0.0–40.0)

## 2024-03-16 LAB — PSA: PSA: 0.65 ng/mL (ref 0.10–4.00)

## 2024-03-16 LAB — VITAMIN D 25 HYDROXY (VIT D DEFICIENCY, FRACTURES): VITD: 12.05 ng/mL — ABNORMAL LOW (ref 30.00–100.00)

## 2024-03-16 LAB — HEMOGLOBIN A1C: Hgb A1c MFr Bld: 7.3 % — ABNORMAL HIGH (ref 4.6–6.5)

## 2024-03-16 LAB — VITAMIN B12: Vitamin B-12: 344 pg/mL (ref 211–911)

## 2024-03-16 MED ORDER — OZEMPIC (0.25 OR 0.5 MG/DOSE) 2 MG/3ML ~~LOC~~ SOPN: 0.5000 mg | PEN_INJECTOR | SUBCUTANEOUS | 3 refills | Status: DC

## 2024-03-16 MED ORDER — ATORVASTATIN CALCIUM 40 MG PO TABS: 40.0000 mg | ORAL_TABLET | Freq: Every day | ORAL | 3 refills | Status: DC

## 2024-03-16 MED ORDER — SILDENAFIL CITRATE 100 MG PO TABS: 50.0000 mg | ORAL_TABLET | Freq: Every day | ORAL | 11 refills | Status: DC | PRN

## 2024-03-16 NOTE — Telephone Encounter (Signed)
 Copied from CRM [id number]. Topic: Clinical - Medication Refill >> Mar 16, 2024  3:35 PM Leah C wrote: Medication: sildenafil  (VIAGRA ) 100 MG tablet;  Semaglutide ,0.25 or 0.5MG /DOS, (OZEMPIC , 0.25 OR 0.5 MG/DOSE,) 2 MG/3ML SOPN  Has the patient contacted their pharmacy? Yes- Medications were sent to the wrong pharmacy, initially sent to [facility]. [facility] the correct [facility].  (Agent: If no, request that the patient contact the pharmacy for the refill. If patient does not wish to contact the pharmacy document the reason why and proceed with request.) (Agent: If yes, when and what did the pharmacy advise?)  This is the patient's preferred pharmacy:  [facility] [address] [place] [address] Phone: [phone] Fax: [phone]  Is this the correct pharmacy for this prescription? Yes If no, delete pharmacy and type the correct one.   Has the prescription been filled recently? Yes  Is the patient out of the medication? Yes  Has the patient been seen for an appointment in the last year OR does the patient have an upcoming appointment? Yes  Can we respond through MyChart? Yes  Agent: Please be advised that Rx refills may take up to 3 business days. We ask that you follow-up with your pharmacy.

## 2024-03-16 NOTE — Assessment & Plan Note (Addendum)
 Age and sex appropriate education and counseling updated with regular exercise and diet Referrals for preventative services - for LDCT screening, also optho for DM eye exam, also for colonoscopy Immunizations addressed - none needed Smoking counseling  - counseled to quit, pt not ready Evidence for depression or other mood disorder - none significant Most recent labs reviewed. I have personally reviewed and have noted: 1) the patient's medical and social history 2) The patient's current medications and supplements 3) The patient's height, weight, and BMI have been recorded in the chart

## 2024-03-16 NOTE — Assessment & Plan Note (Signed)
 Pt counsled to quit, pt not ready

## 2024-03-16 NOTE — Telephone Encounter (Signed)
 Copied from CRM [id number]. Topic: Clinical - Prescription Issue >> Mar 16, 2024  2:54 PM Shawn Rivera wrote: Reason for CRM: Patient is calling in regarding his medications that were ordered at his appointment today. Patient wanted these to go to [facility] and they were sent to the [facility] instead, patient would like these resent to the [facility].

## 2024-03-16 NOTE — Assessment & Plan Note (Signed)
Also for colonoscopy

## 2024-03-16 NOTE — Assessment & Plan Note (Signed)
 Lab Results  Component Value Date   LDLCALC 127 (H) 11/25/2022   Uncontrolled now taking lipitor 40 mg every day, , pt to continue current statin and f/u lab today

## 2024-03-16 NOTE — Assessment & Plan Note (Signed)
 Lab Results  Component Value Date   HGBA1C 6.9 (H) 11/25/2022   Stable, pt to continue current medical treatment  ozempic  0.5 mg weekly, and for f//u lab today

## 2024-03-16 NOTE — Assessment & Plan Note (Signed)
Last vitamin D Lab Results  Component Value Date   VD25OH <7.00 (L) 11/25/2022   Low, to start oral replacement

## 2024-03-16 NOTE — Progress Notes (Signed)
 Patient ID: Shawn Rivera, male   DOB: [birth year], 68 y.o.   MRN: [id number]         Chief Complaint:: wellness exam and dm, ED. Left testicle enlargement, low vit d, smoker, hld       HPI:  Shawn Rivera is a 68 y.o. male here for wellness exam; due for LDCT screening referral, due for eye exam, for colonoscopy, o/w up to date                        Also still smoking, unable to quit.  Pt denies chest pain, increased sob or doe, wheezing, orthopnea, PND, increased LE swelling, palpitations, dizziness or syncope.   Pt denies polydipsia, polyuria, or new focal neuro s/s.    Pt denies fever, wt loss, night sweats, loss of appetite, or other constitutional symptoms  Denies urinary symptoms such as dysuria, frequency, urgency, flank pain, hematuria or n/v, fever, chills, but has enlarging left testicle scrotum with intermittent pain, asks for urology referrall.     Wt Readings from Last 3 Encounters:  11/25/22 244 lb (110.7 kg)  02/26/22 259 lb (117.5 kg)  09/04/21 258 lb (117 kg)   BP Readings from Last 3 Encounters:  11/25/22 136/84  02/26/22 136/78  09/04/21 120/76   Immunization History  Administered Date(s) Administered   Influenza-Unspecified 06/26/2013, 06/14/2014, 06/13/2015, 06/25/2016, 06/17/2017, 07/04/2018, 06/16/2019   PFIZER Comirnaty(Gray Top)Covid-19 Tri-Sucrose Vaccine 10/10/2019, 11/21/2019   PFIZER(Purple Top)SARS-COV-2 Vaccination 10/10/2019, 11/21/2019, 12/03/2020   Pneumococcal Conjugate-13 12/27/2020   Pneumococcal Polysaccharide-23 11/25/2022   Tdap 11/07/2018   Zoster Recombinant(Shingrix ) 02/26/2022, 11/25/2022   Health Maintenance Due  Topic Date Due   OPHTHALMOLOGY EXAM  Never done   Colonoscopy  Never done   HEMOGLOBIN A1C  05/26/2023   Diabetic kidney evaluation - eGFR measurement  11/26/2023   Diabetic kidney evaluation - Urine ACR  11/26/2023      Past Medical History:  Diagnosis Date   Enlarged testicle 12/05/2019   Erectile dysfunction  12/05/2019   HLD (hyperlipidemia) 12/07/2019   Smoker    Type 2 diabetes mellitus without complication, without long-term current use of insulin (HCC) 12/06/2018   Vitamin D  deficiency 12/07/2019   History reviewed. No pertinent surgical history.  reports that he has been smoking. He has never used smokeless tobacco. He reports current alcohol use of about 1.0 standard drink of alcohol per week. He reports that he does not use drugs. family history includes Heart disease in his mother; Hypertension in his mother. No Known Allergies Current Outpatient Medications on File Prior to Visit  Medication Sig Dispense Refill   Cholecalciferol (THERA-D 2000) 50 MCG (2000 UT) TABS 1 tab by mouth once daily 30 tablet 99   No current facility-administered medications on file prior to visit.        ROS:  All others reviewed and negative.  Objective        PE:  There were no vitals taken for this visit.                Constitutional: Pt appears in NAD               HENT: Head: NCAT.                Right Ear: External ear normal.                 Left Ear: External ear normal.  Eyes: . Pupils are equal, round, and reactive to light. Conjunctivae and EOM are normal               Nose: without d/c or deformity               Neck: Neck supple. Gross normal ROM               Cardiovascular: Normal rate and regular rhythm.                 Pulmonary/Chest: Effort normal and breath sounds without rales or wheezing.                Abd:  Soft, NT, ND, + BS, no organomegaly               Neurological: Pt is alert. At baseline orientation, motor grossly intact               Skin: Skin is warm. No rashes, no other new lesions, LE edema - none               Psychiatric: Pt behavior is normal without agitation   Micro: none  Cardiac tracings I have personally interpreted today:  none  Pertinent Radiological findings (summarize): none   Lab Results  Component Value Date   WBC 9.7 11/25/2022    HGB 14.9 11/25/2022   HCT 44.3 11/25/2022   PLT 214.0 11/25/2022   GLUCOSE 107 (H) 11/25/2022   CHOL 185 11/25/2022   TRIG 110.0 11/25/2022   HDL 35.50 (L) 11/25/2022   LDLCALC 127 (H) 11/25/2022   ALT 15 11/25/2022   AST 22 11/25/2022   NA 139 11/25/2022   K 3.9 11/25/2022   CL 110 11/25/2022   CREATININE 0.81 11/25/2022   BUN 10 11/25/2022   CO2 21 11/25/2022   TSH 2.37 11/25/2022   PSA 1.06 11/25/2022   HGBA1C 6.9 (H) 11/25/2022   MICROALBUR <0.7 11/25/2022   Assessment/Plan:  Shawn Rivera is a 69 y.o. Black or African American [2] male with  has a past medical history of Enlarged testicle (12/05/2019), Erectile dysfunction (12/05/2019), HLD (hyperlipidemia) (12/07/2019), Smoker, Type 2 diabetes mellitus without complication, without long-term current use of insulin (HCC) (12/06/2018), and Vitamin D  deficiency (12/07/2019).  Encounter for well adult exam with abnormal findings Age and sex appropriate education and counseling updated with regular exercise and diet Referrals for preventative services - for LDCT screening, also optho for DM eye exam, also for colonoscopy Immunizations addressed - none needed Smoking counseling  - counseled to quit, pt not ready Evidence for depression or other mood disorder - none significant Most recent labs reviewed. I have personally reviewed and have noted: 1) the patient's medical and social history 2) The patient's current medications and supplements 3) The patient's height, weight, and BMI have been recorded in the chart   Vitamin D  deficiency Last vitamin D  Lab Results  Component Value Date   VD25OH <7.00 (L) 11/25/2022   Low, to start oral replacement   Type 2 diabetes mellitus without complication, without long-term current use of insulin (HCC) Lab Results  Component Value Date   HGBA1C 6.9 (H) 11/25/2022   Stable, pt to continue current medical treatment  ozempic  0.5 mg weekly, and for f//u lab today   Smoker Pt counsled  to quit, pt not ready  Hypertension BP Readings from Last 3 Encounters:  11/25/22 136/84  02/26/22 136/78  09/04/21 120/76   Stable, pt to continue medical treatment  -  diet, wt control   HLD (hyperlipidemia) Lab Results  Component Value Date   LDLCALC 127 (H) 11/25/2022   Uncontrolled now taking lipitor 40 mg every day, , pt to continue current statin and f/u lab today   Screen for colon cancer Also for colonoscopy  Enlarged testicle Also for urology referral  Erectile dysfunction Stable, cont viagra  prn  Followup: Return in about 6 months (around 09/15/2024).  Rosalia Colonel, MD 03/16/2024 10:21 AM [facility]

## 2024-03-16 NOTE — Telephone Encounter (Signed)
 Pharmacy Patient Advocate Encounter  Received notification from [employer] that Prior Authorization for Ozempic  (0.25 or 0.5 MG/DOSE) 2MG /3ML pen-injectors has been APPROVED from 09/29/2023 to 09/27/2024   PA #/Case ID/Reference #: [id number]

## 2024-03-16 NOTE — Assessment & Plan Note (Signed)
Also for urology referral

## 2024-03-16 NOTE — Assessment & Plan Note (Signed)
Stable, cont viagra prn 

## 2024-03-16 NOTE — Telephone Encounter (Signed)
 Pharmacy Patient Advocate Encounter   Received notification from Patient Pharmacy that prior authorization for Ozempic  (0.25 or 0.5 MG/DOSE) 2MG /3ML pen-injectors is required/requested.   Insurance verification completed.   The patient is insured through [employer] .   Per test claim: PA required; PA submitted to above mentioned insurance via CoverMyMeds Key/confirmation #/EOC [facility] Status is pending

## 2024-03-16 NOTE — Patient Instructions (Signed)
 Please continue all other medications as before, and refills have been done for the viagra  and other medications  Please have the pharmacy call with any other refills you may need.  Please continue your efforts at being more active, low cholesterol diet, and weight control.  You are otherwise up to date with prevention measures today.  Please keep your appointments with your specialists as you may have planned  You will be contacted regarding the referral for: Pulmonary for the yearly CT chest, GI for the colonoscopy, and Urology  Please go to the LAB at the blood drawing area for the tests to be done  You will be contacted by phone if any changes need to be made immediately.  Otherwise, you will receive a letter about your results with an explanation, but please check with MyChart first.  Please make an Appointment to return in 6 months, or sooner if needed

## 2024-03-16 NOTE — Assessment & Plan Note (Signed)
 BP Readings from Last 3 Encounters:  11/25/22 136/84  02/26/22 136/78  09/04/21 120/76   Stable, pt to continue medical treatment  - diet, wt control

## 2024-03-17 ENCOUNTER — Other Ambulatory Visit: Payer: Self-pay

## 2024-03-17 ENCOUNTER — Telehealth: Payer: Self-pay

## 2024-03-17 ENCOUNTER — Other Ambulatory Visit (HOSPITAL_COMMUNITY): Payer: Self-pay

## 2024-03-17 ENCOUNTER — Ambulatory Visit: Payer: Self-pay | Admitting: Internal Medicine

## 2024-03-17 ENCOUNTER — Other Ambulatory Visit: Payer: Self-pay | Admitting: Internal Medicine

## 2024-03-17 MED ORDER — OZEMPIC (0.25 OR 0.5 MG/DOSE) 2 MG/3ML ~~LOC~~ SOPN
0.5000 mg | PEN_INJECTOR | SUBCUTANEOUS | 3 refills | Status: DC
  Filled 2024-03-17: qty 12, 84d supply, fill #0

## 2024-03-17 MED ORDER — SEMAGLUTIDE (1 MG/DOSE) 4 MG/3ML ~~LOC~~ SOPN
1.0000 mg | PEN_INJECTOR | SUBCUTANEOUS | 11 refills | Status: DC
  Filled 2024-03-17: qty 3, 28d supply, fill #0

## 2024-03-17 MED ORDER — ATORVASTATIN CALCIUM 80 MG PO TABS
80.0000 mg | ORAL_TABLET | Freq: Every day | ORAL | 3 refills | Status: DC
  Filled 2024-03-17: qty 90, 90d supply, fill #0

## 2024-03-17 MED ORDER — ATORVASTATIN CALCIUM 40 MG PO TABS
40.0000 mg | ORAL_TABLET | Freq: Every day | ORAL | 3 refills | Status: DC
  Filled 2024-03-17: qty 90, 90d supply, fill #0

## 2024-03-17 MED ORDER — SILDENAFIL CITRATE 100 MG PO TABS
50.0000 mg | ORAL_TABLET | Freq: Every day | ORAL | 11 refills | Status: DC | PRN
  Filled 2024-03-17: qty 20, 20d supply, fill #0

## 2024-03-17 NOTE — Telephone Encounter (Signed)
 Prescriptions have been sent to [facility].

## 2024-03-18 ENCOUNTER — Other Ambulatory Visit (HOSPITAL_COMMUNITY): Payer: Self-pay

## 2024-03-27 ENCOUNTER — Other Ambulatory Visit (HOSPITAL_COMMUNITY): Payer: Self-pay

## 2024-03-28 ENCOUNTER — Other Ambulatory Visit (HOSPITAL_COMMUNITY): Payer: Self-pay

## 2024-03-28 NOTE — Telephone Encounter (Signed)
 A user error has taken place: encounter opened in error, closed for administrative reasons.

## 2024-03-29 ENCOUNTER — Telehealth: Payer: Self-pay | Admitting: Internal Medicine

## 2024-03-29 ENCOUNTER — Telehealth: Payer: Self-pay

## 2024-03-29 ENCOUNTER — Other Ambulatory Visit (HOSPITAL_COMMUNITY): Payer: Self-pay

## 2024-03-29 NOTE — Telephone Encounter (Signed)
 Pharmacy Patient Advocate Encounter   Received notification from CoverMyMeds that prior authorization for Ozempic  2 is required/requested. Patient has a current approved PA see encounter 03/16/24.    Insurance verification completed.   The patient is insured through [facility] .   Per test claim: The current 28 day co-pay is, $297.  No PA needed at this time. This test claim was processed through [facility]- copay amounts may vary at other pharmacies due to pharmacy/plan contracts, or as the patient moves through the different stages of their insurance plan.

## 2024-03-29 NOTE — Telephone Encounter (Signed)
 Copied from CRM [id number]. Topic: Clinical - Medication Question >> Mar 29, 2024  2:15 PM Franky GRADE wrote: Reason for CRM: Patient is calling because none of his medications prescriptions were not received by the pharmacy, he would like to know if we can resend or call them in for a verbal.

## 2024-03-30 ENCOUNTER — Other Ambulatory Visit (HOSPITAL_COMMUNITY): Payer: Self-pay

## 2024-03-30 ENCOUNTER — Other Ambulatory Visit: Payer: Self-pay

## 2024-03-30 MED ORDER — SILDENAFIL CITRATE 100 MG PO TABS
50.0000 mg | ORAL_TABLET | Freq: Every day | ORAL | 11 refills | Status: DC | PRN
  Filled 2024-03-30: qty 20, 20d supply, fill #0
  Filled 2024-05-09: qty 20, 20d supply, fill #1
  Filled 2024-08-01: qty 20, 20d supply, fill #2

## 2024-03-30 MED ORDER — ATORVASTATIN CALCIUM 80 MG PO TABS
80.0000 mg | ORAL_TABLET | Freq: Every day | ORAL | 3 refills | Status: AC
  Filled 2024-03-30: qty 90, 90d supply, fill #0

## 2024-03-30 MED ORDER — SEMAGLUTIDE (1 MG/DOSE) 4 MG/3ML ~~LOC~~ SOPN
1.0000 mg | PEN_INJECTOR | SUBCUTANEOUS | 11 refills | Status: AC
Start: 2024-03-30 — End: ?
  Filled 2024-03-30: qty 3, 28d supply, fill #0

## 2024-03-30 NOTE — Telephone Encounter (Signed)
 Refills have been resent to [facility].

## 2024-04-04 ENCOUNTER — Other Ambulatory Visit (HOSPITAL_COMMUNITY): Payer: Self-pay

## 2024-05-09 ENCOUNTER — Other Ambulatory Visit (HOSPITAL_COMMUNITY): Payer: Self-pay

## 2024-05-22 ENCOUNTER — Encounter: Payer: Self-pay | Admitting: Internal Medicine

## 2024-07-31 ENCOUNTER — Other Ambulatory Visit: Payer: Self-pay | Admitting: Internal Medicine

## 2024-07-31 ENCOUNTER — Other Ambulatory Visit (HOSPITAL_COMMUNITY): Payer: Self-pay

## 2024-07-31 ENCOUNTER — Encounter: Payer: Self-pay | Admitting: Radiology

## 2024-07-31 NOTE — Telephone Encounter (Unsigned)
 Copied from CRM [id number]. Topic: Clinical - Medication Refill >> Jul 31, 2024 11:34 AM Viola F wrote: Medication: sildenafil  (VIAGRA ) 100 MG tablet [[id number]]  Has the patient contacted their pharmacy? Yes (Agent: If no, request that the patient contact the pharmacy for the refill. If patient does not wish to contact the pharmacy document the reason why and proceed with request.) (Agent: If yes, when and what did the pharmacy advise?)  This is the patient's preferred pharmacy:  [facility] [address] [place] [address] Phone: [phone] Fax: [phone]  Is this the correct pharmacy for this prescription? Yes If no, delete pharmacy and type the correct one.   Has the prescription been filled recently? Yes  Is the patient out of the medication? Yes, 2 weeks  Has the patient been seen for an appointment in the last year OR does the patient have an upcoming appointment? Yes  Can we respond through MyChart? Yes  Agent: Please be advised that Rx refills may take up to 3 business days. We ask that you follow-up with your pharmacy.

## 2024-08-01 ENCOUNTER — Other Ambulatory Visit (HOSPITAL_COMMUNITY): Payer: Self-pay

## 2024-08-04 ENCOUNTER — Other Ambulatory Visit (HOSPITAL_COMMUNITY): Payer: Self-pay

## 2024-08-04 MED ORDER — SILDENAFIL CITRATE 100 MG PO TABS
50.0000 mg | ORAL_TABLET | Freq: Every day | ORAL | 11 refills | Status: AC | PRN
  Filled 2024-08-04 – 2024-09-11 (×2): qty 20, 20d supply, fill #0
  Filled 2024-10-06: qty 20, 20d supply, fill #1

## 2024-08-07 ENCOUNTER — Other Ambulatory Visit (HOSPITAL_COMMUNITY): Payer: Self-pay

## 2024-08-23 ENCOUNTER — Other Ambulatory Visit (HOSPITAL_COMMUNITY): Payer: Self-pay

## 2024-09-11 ENCOUNTER — Other Ambulatory Visit (HOSPITAL_COMMUNITY): Payer: Self-pay

## 2024-10-06 ENCOUNTER — Other Ambulatory Visit (HOSPITAL_COMMUNITY): Payer: Self-pay

## 2024-10-26 ENCOUNTER — Encounter: Payer: Self-pay | Admitting: Family Medicine

## 2024-10-26 ENCOUNTER — Other Ambulatory Visit (HOSPITAL_COMMUNITY): Payer: Self-pay

## 2024-10-26 ENCOUNTER — Ambulatory Visit: Admitting: Family Medicine

## 2024-10-26 ENCOUNTER — Encounter: Payer: Self-pay | Admitting: Internal Medicine

## 2024-10-26 VITALS — BP 138/70 | HR 65 | Temp 98.1°F | Ht >= 80 in | Wt 241.8 lb

## 2024-10-26 DIAGNOSIS — R29898 Other symptoms and signs involving the musculoskeletal system: Secondary | ICD-10-CM | POA: Diagnosis not present

## 2024-10-26 DIAGNOSIS — J208 Acute bronchitis due to other specified organisms: Secondary | ICD-10-CM | POA: Diagnosis not present

## 2024-10-26 LAB — POC COVID19 BINAXNOW: SARS Coronavirus 2 Ag: NEGATIVE

## 2024-10-26 LAB — POCT INFLUENZA A/B
Influenza A, POC: NEGATIVE
Influenza B, POC: NEGATIVE

## 2024-10-26 MED ORDER — ALBUTEROL SULFATE HFA 108 (90 BASE) MCG/ACT IN AERS
2.0000 | INHALATION_SPRAY | Freq: Four times a day (QID) | RESPIRATORY_TRACT | 0 refills | Status: AC | PRN
  Filled 2024-10-26: qty 6.7, 20d supply, fill #0

## 2024-10-26 NOTE — Progress Notes (Signed)
 "  Acute Office Visit  Subjective:     Patient ID: Shawn Rivera, male    DOB: 08/22/1956, 69 y.o.   MRN: [id number]  Chief Complaint  Patient presents with   Acute Visit    Persistent cough/congestion, mucous feels like collecting in chest area. Knot in middle of chest    HPI  Discussed the use of AI scribe software for clinical note transcription with the patient, who gave verbal consent to proceed.  History of Present Illness Shawn Rivera is a 69 year old male who presents with symptoms of a cold.  Upper respiratory symptoms - Two days of cough and minimal nasal discharge - No medication taken for symptoms  Lower respiratory symptoms - Wheezing when lying down, attributed to current illness - History of bronchitis, no longer has an inhaler  Constitutional symptoms - Fever and chills with fluctuating temperature yesterday at work, left early - No fever or chills today  Gastrointestinal symptoms - No gastrointestinal symptoms today     ROS Per HPI      Objective:    BP 138/70   Pulse 65   Temp 98.1 F (36.7 C) (Oral)   Ht 6' 8 (2.032 m)   Wt 241 lb 12.8 oz (109.7 kg)   SpO2 98%   BMI 26.56 kg/m    Physical Exam Vitals and nursing note reviewed.  Constitutional:      General: He is not in acute distress.    Appearance: Normal appearance.  HENT:     Head: Normocephalic and atraumatic.     Right Ear: External ear normal.     Left Ear: External ear normal.     Nose: Nose normal.     Mouth/Throat:     Mouth: Mucous membranes are moist.     Pharynx: Oropharynx is clear.  Eyes:     Extraocular Movements: Extraocular movements intact.  Cardiovascular:     Rate and Rhythm: Normal rate and regular rhythm.     Pulses: Normal pulses.     Heart sounds: Normal heart sounds.  Pulmonary:     Effort: Pulmonary effort is normal. No respiratory distress.     Breath sounds: Normal breath sounds. No wheezing, rhonchi or rales.     Comments: Dry  cough  Chest:       Comments: Area of hard, non tender bulge to the chest Musculoskeletal:        General: Normal range of motion.     Cervical back: Normal range of motion.     Right lower leg: No edema.     Left lower leg: No edema.  Lymphadenopathy:     Cervical: No cervical adenopathy.  Skin:    General: Skin is warm and dry.  Neurological:     General: No focal deficit present.     Mental Status: He is alert and oriented to person, place, and time.  Psychiatric:        Mood and Affect: Mood normal.        Behavior: Behavior normal.     Results for orders placed or performed in visit on 10/26/24  POC COVID-19 BinaxNow  Result Value Ref Range   SARS Coronavirus 2 Ag Negative Negative  POCT Influenza A/B  Result Value Ref Range   Influenza A, POC Negative Negative   Influenza B, POC Negative Negative        Assessment & Plan:   Assessment and Plan Assessment & Plan Acute bronchitis Cough and wheezing, especially when supine.  Differential includes influenza and COVID-19. - Ordered influenza and COVID-19 tests, negative today - Prescribed inhaler for wheezing.  Prominent Xiphoid - reviewed CXR form 2017, confirmed prominent xiphoid process - reassured     Orders Placed This Encounter  Procedures   POC COVID-19 BinaxNow   POCT Influenza A/B     Meds ordered this encounter  Medications   albuterol  (VENTOLIN  HFA) 108 (90 Base) MCG/ACT inhaler    Sig: Inhale 2 puffs into the lungs every 6 (six) hours as needed for wheezing or shortness of breath.    Dispense:  6.7 g    Refill:  0    Return if symptoms worsen or fail to improve.  Corean LITTIE Ku, FNP  "

## 2024-10-26 NOTE — Patient Instructions (Addendum)
 COVID and flu testing were negative today.  I have sent in an albuterol  inhaler for you to use 2 puffs every 4 hours as needed for wheezing.  Continue supportive care at home for now. May return to work when you are fever free for 24 hours without fever reducing medication.  Follow-up with me for new or worsening symptoms.

## 2025-05-03 ENCOUNTER — Ambulatory Visit: Admitting: Family Medicine
